# Patient Record
Sex: Female | Born: 1960
Health system: Southern US, Community
[De-identification: ages and names within clinical notes are randomized; demographics above are authoritative.]

## PROBLEM LIST (undated history)

## (undated) DIAGNOSIS — R7303 Prediabetes: Secondary | ICD-10-CM

## (undated) DIAGNOSIS — I10 Essential (primary) hypertension: Secondary | ICD-10-CM

## (undated) HISTORY — DX: Essential (primary) hypertension: I10

## (undated) HISTORY — DX: Prediabetes: R73.03

---

## 2018-05-01 ENCOUNTER — Emergency Department (HOSPITAL_COMMUNITY)
Admission: EM | Admit: 2018-05-01 | Discharge: 2018-05-01 | Disposition: A | Discharge status: Home / Self Care | Payer: Self-pay | Attending: Emergency Medicine | Admitting: Emergency Medicine

## 2018-05-01 ENCOUNTER — Encounter: Payer: Self-pay | Admitting: Emergency Medicine

## 2018-05-01 DIAGNOSIS — L0291 Cutaneous abscess, unspecified: Secondary | ICD-10-CM

## 2018-05-01 DIAGNOSIS — L02212 Cutaneous abscess of back [any part, except buttock]: Secondary | ICD-10-CM | POA: Insufficient documentation

## 2018-05-01 MED ORDER — CEPHALEXIN 500 MG PO CAPS
500.0000 mg | ORAL_CAPSULE | Freq: Once | ORAL | Status: AC
  Administered 2018-05-01: 500 mg via ORAL
  Filled 2018-05-01: qty 1

## 2018-05-01 MED ORDER — CEPHALEXIN 500 MG PO CAPS: 500.0000 mg | ORAL_CAPSULE | Freq: Four times a day (QID) | ORAL | 0 refills | Status: AC

## 2018-05-01 MED ORDER — SULFAMETHOXAZOLE-TRIMETHOPRIM 800-160 MG PO TABS
1.0000 | ORAL_TABLET | Freq: Once | ORAL | Status: AC
  Administered 2018-05-01: 1 via ORAL
  Filled 2018-05-01: qty 1

## 2018-05-01 MED ORDER — SULFAMETHOXAZOLE-TRIMETHOPRIM 800-160 MG PO TABS: 1.0000 | ORAL_TABLET | Freq: Two times a day (BID) | ORAL | 0 refills | Status: AC

## 2018-05-01 MED ORDER — LIDOCAINE HCL (PF) 1 % IJ SOLN
5.0000 mL | Freq: Once | INTRAMUSCULAR | Status: AC
  Administered 2018-05-01: 5 mL
  Filled 2018-05-01: qty 30

## 2018-05-01 NOTE — Discharge Instructions (Addendum)
Follow up in 2 days for wound check and packing removal. Return sooner for any problems.

## 2018-05-01 NOTE — ED Provider Notes (Signed)
[facility]-EMERGENCY DEPT Provider Note   CSN: [id number] Arrival date & time: 05/01/18  1610     History   Chief Complaint Chief Complaint  Patient presents with  . Abscess    HPI Kathryne Hitchmani Karim-Taylor is a 57 y.o. female who presents to the ED with c/o abscess to her left side just under her bra strap. Patient reports that symptoms started a week ago and have gotten worse.   HPI  History reviewed. No pertinent past medical history.  There are no active problems to display for this patient.   History reviewed. No pertinent surgical history.   OB History   No obstetric history on file.      Home Medications    Prior to Admission medications   Medication Sig Start Date End Date Taking? Authorizing Provider  cephALEXin (KEFLEX) 500 MG capsule Take 1 capsule (500 mg total) by mouth 4 (four) times daily. 05/01/18   Janne NapoleonNeese, Clearnce Leja M, NP  sulfamethoxazole-trimethoprim (BACTRIM DS,SEPTRA DS) 800-160 MG tablet Take 1 tablet by mouth 2 (two) times daily for 7 days. 05/01/18 05/08/18  Janne NapoleonNeese, Daviyon Widmayer M, NP    Family History No family history on file.  Social History Social History   Tobacco Use  . Smoking status: Former Games developermoker  . Smokeless tobacco: Never Used  Substance Use Topics  . Alcohol use: Never    Frequency: Never  . Drug use: Never     Allergies   Patient has no allergy information on record.   Review of Systems Review of Systems   Physical Exam Updated Vital Signs BP (!) 184/110 (BP Location: Left Arm)   Pulse 82   Temp 98.1 F (36.7 C) (Oral)   Resp 18   Ht 5\' 4"  (1.626 m)   Wt 96.3 kg   SpO2 96%   BMI 36.42 kg/m   Physical Exam Vitals signs and nursing note reviewed.  Constitutional:      General: She is not in acute distress.    Appearance: She is well-developed. She is obese.  HENT:     Head: Normocephalic.     Mouth/Throat:     Mouth: Mucous membranes are moist.  Eyes:     Extraocular Movements: Extraocular  movements intact.  Neck:     Musculoskeletal: Neck supple.  Cardiovascular:     Rate and Rhythm: Normal rate.  Pulmonary:     Effort: Pulmonary effort is normal.  Musculoskeletal: Normal range of motion.  Skin:    General: Skin is warm and dry.          Comments: Approximately 3 cm raised, tender area with pustular center to the left side with surrounding erythema.   Neurological:     Mental Status: She is alert and oriented to person, place, and time.  Psychiatric:        Mood and Affect: Mood normal.      ED Treatments / Results  Labs (all labs ordered are listed, but only abnormal results are displayed) Labs Reviewed - No data to display  EKG None  Radiology No results found.  Procedures .Marland Kitchen.Incision and Drainage Date/Time: 05/01/2018 7:16 PM Performed by: Janne NapoleonNeese, Miarose Lippert M, NP Authorized by: Janne NapoleonNeese, Shermaine Brigham M, NP   Consent:    Consent obtained:  Verbal   Consent given by:  Patient   Risks discussed:  Bleeding and incomplete drainage   Alternatives discussed:  Alternative treatment Location:    Type:  Abscess   Location:  Trunk   Trunk location: left  side. Pre-procedure details:    Skin preparation:  Betadine Anesthesia (see MAR for exact dosages):    Anesthesia method:  Local infiltration   Local anesthetic:  Lidocaine 1% w/o epi Procedure type:    Complexity:  Complex Procedure details:    Needle aspiration: no     Incision types:  Single straight   Incision depth:  Dermal   Scalpel blade:  11   Wound management:  Probed and deloculated and irrigated with saline   Drainage:  Purulent   Drainage amount:  Copious   Packing materials:  1/4 in iodoform gauze Post-procedure details:    Patient tolerance of procedure:  Tolerated well, no immediate complications   (including critical care time)  Medications Ordered in ED Medications  lidocaine (PF) (XYLOCAINE) 1 % injection 5 mL (has no administration in time range)  cephALEXin (KEFLEX) capsule 500 mg (has  no administration in time range)  sulfamethoxazole-trimethoprim (BACTRIM DS,SEPTRA DS) 800-160 MG per tablet 1 tablet (has no administration in time range)     Initial Impression / Assessment and Plan / ED Course  I have reviewed the triage vital signs and the nursing notes. 57 y.o. female here with abscess to the left side stable for d/c after I&D without fever or red streaking. Will have patient f/u in 2 days for wound check and packing removal. She will return sooner for any problems. Will start antibiotics.   Final Clinical Impressions(s) / ED Diagnoses   Final diagnoses:  Abscess    ED Discharge Orders         Ordered    cephALEXin (KEFLEX) 500 MG capsule  4 times daily     05/01/18 1913    sulfamethoxazole-trimethoprim (BACTRIM DS,SEPTRA DS) 800-160 MG tablet  2 times daily     05/01/18 1913           Kerrie Buffaloeese, Calla Wedekind Plum[place]M, [place]NP 05/01/18 1918    Jacalyn LefevreHaviland, Julie, MD 05/01/18 2238

## 2018-05-01 NOTE — ED Triage Notes (Signed)
Patient here from home with complaints of abscess to left side under bra strap x1 week.

## 2018-05-03 ENCOUNTER — Encounter (HOSPITAL_COMMUNITY): Payer: Self-pay | Admitting: Emergency Medicine

## 2018-05-03 ENCOUNTER — Other Ambulatory Visit: Payer: Self-pay

## 2018-05-03 ENCOUNTER — Emergency Department (HOSPITAL_COMMUNITY)
Admission: EM | Admit: 2018-05-03 | Discharge: 2018-05-03 | Disposition: A | Discharge status: Home / Self Care | Payer: Self-pay | Attending: Emergency Medicine | Admitting: Emergency Medicine

## 2018-05-03 DIAGNOSIS — Z5189 Encounter for other specified aftercare: Secondary | ICD-10-CM | POA: Insufficient documentation

## 2018-05-03 DIAGNOSIS — Z87891 Personal history of nicotine dependence: Secondary | ICD-10-CM | POA: Insufficient documentation

## 2018-05-03 DIAGNOSIS — L0291 Cutaneous abscess, unspecified: Secondary | ICD-10-CM

## 2018-05-03 DIAGNOSIS — N611 Abscess of the breast and nipple: Secondary | ICD-10-CM | POA: Insufficient documentation

## 2018-05-03 NOTE — ED Provider Notes (Signed)
[facility]-EMERGENCY DEPT Provider Note   CSN: [id number] Arrival date & time: 05/03/18  1045     History   Chief Complaint Chief Complaint  Patient presents with  . Wound Check    HPI Hailey Barry is a 57 y.o. female who presents today for evaluation of wound recheck.  Patient was seen on 05/01/18 for abscess noted to left lateral side just underneath the breast.  At that time, she had the abscess drained and backing inserted.  Additionally, patient was discharged on antibiotics which she states she has been compliant with.  Patient states that she remove the drain in the shower this morning and did not notice any purulent drainage from the area.  She states that the surrounding redness and size has improved but she still feels a little bit of pain and soreness around the area.  She states she has not had any fevers.  The history is provided by the patient.    History reviewed. No pertinent past medical history.  There are no active problems to display for this patient.   History reviewed. No pertinent surgical history.   OB History   No obstetric history on file.      Home Medications    Prior to Admission medications   Medication Sig Start Date End Date Taking? Authorizing Provider  cephALEXin (KEFLEX) 500 MG capsule Take 1 capsule (500 mg total) by mouth 4 (four) times daily. 05/01/18   Janne NapoleonNeese, Hope M, NP  sulfamethoxazole-trimethoprim (BACTRIM DS,SEPTRA DS) 800-160 MG tablet Take 1 tablet by mouth 2 (two) times daily for 7 days. 05/01/18 05/08/18  Janne NapoleonNeese, Hope M, NP    Family History No family history on file.  Social History Social History   Tobacco Use  . Smoking status: Former Games developermoker  . Smokeless tobacco: Never Used  Substance Use Topics  . Alcohol use: Never    Frequency: Never  . Drug use: Never     Allergies   Patient has no known allergies.   Review of Systems Review of Systems  Constitutional: Negative for fever.    Skin: Positive for wound. Negative for color change.  All other systems reviewed and are negative.    Physical Exam Updated Vital Signs BP (!) 140/105 (BP Location: Left Arm)   Temp 98.1 F (36.7 C) (Oral)   Resp 16   SpO2 97%   Physical Exam Vitals signs and nursing note reviewed.  Constitutional:      Appearance: She is well-developed.  HENT:     Head: Normocephalic and atraumatic.  Eyes:     General: No scleral icterus.       Right eye: No discharge.        Left eye: No discharge.     Conjunctiva/sclera: Conjunctivae normal.  Pulmonary:     Effort: Pulmonary effort is normal.  Chest:    Skin:    General: Skin is warm and dry.  Neurological:     Mental Status: She is alert.  Psychiatric:        Speech: Speech normal.        Behavior: Behavior normal.      ED Treatments / Results  Labs (all labs ordered are listed, but only abnormal results are displayed) Labs Reviewed - No data to display  EKG None  Radiology No results found.  Procedures Wound packing Date/Time: 05/03/2018 12:58 PM Performed by: Maxwell CaulLayden, Lindsey A, PA-C Authorized by: Maxwell CaulLayden, Lindsey A, PA-C  Unsuccessful attempt Consent: Verbal consent obtained. Consent  given by: patient Patient understanding: patient states understanding of the procedure being performed Patient consent: the patient's understanding of the procedure matches consent given Procedure consent: procedure consent matches procedure scheduled Relevant documents: relevant documents present and verified Test results: test results available and properly labeled Site marked: the operative site was marked Imaging studies: imaging studies not available Patient identity confirmed: verbally with patient Comments: Once the area was cleaned, it was thoroughly and extensively irrigated with sterile saline.  There was some serosanguineous drainage with some purulent material expressed from the wound.  There appeared to be some  loculations of fluid.  Attempted to break up loculations but patient was unable to tolerate procedure.  Additionally, attempted to place a small amount of packing to keep the incision open but patient would not proceed and asked that the procedure be terminated.  Procedure was terminated at patient's request.    (including critical care time)  Medications Ordered in ED Medications - No data to display   Initial Impression / Assessment and Plan / ED Course  I have reviewed the triage vital signs and the nursing notes.  Pertinent labs & imaging results that were available during my care of the patient were reviewed by me and considered in my medical decision making (see chart for details).     57 year old female who presents for evaluation of wound check.  Was seen on 05/01/17 for abscess to left lateral side.  At that time, she had packing placed and was discharged home on antibiotics.  Comes back today for wound recheck.  She removed packing herself at home.  No fevers. Patient is afebrile, non-toxic appearing, sitting comfortably on examination table. Vital signs reviewed and stable.  On exam, she has a 0.5 cm linear laceration on left lateral side with some surrounding induration.  No overlying warmth, erythema.  Additionally, she has some palpable areas of loculation noted just lateral to the incision site.  When dressing was removed, there was serosanguineous drainage with some purulent material mixed in.  I discussed with patient that I would like to do further debridement of the wound and reinsert packing to continue to keep the incision open.  Offered local anesthesia but patient declined.  Wound debridement as documented above.  After irrigation, was able to get some amount of discharge from the area.  Attempted to insert some replace packing but patient asked that the procedure be terminated.  I discussed with patient the risk versus benefits of stopping the procedure at this time,  including but not limited to worsening of abscess, return of abscess.  Patient expresses understanding and wishes to proceed with termination of procedure.  Patient exhibits full medical decision-making capacity.  Start the patient on at home care measures.  Instructed patient to continue taking her antibiotics. Patient had ample opportunity for questions and discussion. All patient's questions were answered with full understanding. Strict return precautions discussed. Patient expresses understanding and agreement to plan.    Portions of this note were generated with Scientist, clinical (histocompatibility and immunogenetics)Dragon dictation software. Dictation errors may occur despite best attempts at proofreading.   Final Clinical Impressions(s) / ED Diagnoses   Final diagnoses:  Visit for wound check  Abscess    ED Discharge Orders    None       Maxwell CaulLayden, Lindsey A, PA-C 05/03/18 1344    Virgina NorfolkCuratolo, Adam, DO 05/03/18 1900

## 2018-05-03 NOTE — Discharge Instructions (Signed)
Apply warm compresses to the area to help continue express drainage.   Keep the wound clean and dry. Gently wash the wound with soap and water and make sure to pat it dry.   Take antibiotic and complete the entire course.   You can take Tylenol or Ibuprofen as directed for pain.  [facility] to establish a primary care doctor if you do not have one.   Return to the Emergency Department if you experienced any worsening/spreading redness or swelling, fever, worsening pain, or any other worsening or concerning symptoms.

## 2018-05-03 NOTE — ED Notes (Signed)
Bed: WTR5 Expected date:  Expected time:  Means of arrival:  Comments: 

## 2018-05-03 NOTE — ED Triage Notes (Signed)
Pt here for wound check per Hope's instructions, pt had abscess drained 12/26. Pt removed packing when showering today, no drainage noted.

## 2018-09-08 ENCOUNTER — Emergency Department (HOSPITAL_COMMUNITY): Payer: Self-pay

## 2018-09-08 ENCOUNTER — Emergency Department (HOSPITAL_COMMUNITY)
Admission: EM | Admit: 2018-09-08 | Discharge: 2018-09-08 | Disposition: A | Discharge status: Home / Self Care | Payer: Self-pay | Attending: Emergency Medicine | Admitting: Emergency Medicine

## 2018-09-08 ENCOUNTER — Encounter (HOSPITAL_COMMUNITY): Payer: Self-pay | Admitting: Emergency Medicine

## 2018-09-08 ENCOUNTER — Other Ambulatory Visit: Payer: Self-pay

## 2018-09-08 DIAGNOSIS — Z79899 Other long term (current) drug therapy: Secondary | ICD-10-CM | POA: Insufficient documentation

## 2018-09-08 DIAGNOSIS — R1011 Right upper quadrant pain: Secondary | ICD-10-CM

## 2018-09-08 DIAGNOSIS — F17210 Nicotine dependence, cigarettes, uncomplicated: Secondary | ICD-10-CM | POA: Insufficient documentation

## 2018-09-08 DIAGNOSIS — R197 Diarrhea, unspecified: Secondary | ICD-10-CM

## 2018-09-08 DIAGNOSIS — E860 Dehydration: Secondary | ICD-10-CM | POA: Insufficient documentation

## 2018-09-08 DIAGNOSIS — R112 Nausea with vomiting, unspecified: Secondary | ICD-10-CM | POA: Insufficient documentation

## 2018-09-08 LAB — LIPASE, BLOOD: Lipase: 31 U/L (ref 11–51)

## 2018-09-08 LAB — URINALYSIS, ROUTINE W REFLEX MICROSCOPIC
Bilirubin Urine: NEGATIVE
Glucose, UA: NEGATIVE mg/dL
Ketones, ur: 20 mg/dL — AB
Leukocytes,Ua: NEGATIVE
Nitrite: NEGATIVE
Protein, ur: 30 mg/dL — AB
Specific Gravity, Urine: 1.021 (ref 1.005–1.030)
pH: 5 (ref 5.0–8.0)

## 2018-09-08 LAB — COMPREHENSIVE METABOLIC PANEL
ALT: 41 U/L (ref 0–44)
AST: 41 U/L (ref 15–41)
Albumin: 3.8 g/dL (ref 3.5–5.0)
Alkaline Phosphatase: 70 U/L (ref 38–126)
Anion gap: 10 (ref 5–15)
BUN: 11 mg/dL (ref 6–20)
CO2: 23 mmol/L (ref 22–32)
Calcium: 8.8 mg/dL — ABNORMAL LOW (ref 8.9–10.3)
Chloride: 104 mmol/L (ref 98–111)
Creatinine, Ser: 0.79 mg/dL (ref 0.44–1.00)
GFR calc Af Amer: >60 mL/min (ref >60)
GFR calc non Af Amer: >60 mL/min (ref >60)
Glucose, Bld: 131 mg/dL — ABNORMAL HIGH (ref 70–99)
Potassium: 3.5 mmol/L (ref 3.5–5.1)
Sodium: 137 mmol/L (ref 135–145)
Total Bilirubin: 0.6 mg/dL (ref 0.3–1.2)
Total Protein: 7.6 g/dL (ref 6.5–8.1)

## 2018-09-08 LAB — CBC WITH DIFFERENTIAL/PLATELET
Abs Immature Granulocytes: 0.06 10*3/uL (ref 0.00–0.07)
Basophils Absolute: 0 10*3/uL (ref 0.0–0.1)
Basophils Relative: 1 %
Eosinophils Absolute: 0 10*3/uL (ref 0.0–0.5)
Eosinophils Relative: 0 %
HCT: 43.8 % (ref 36.0–46.0)
Hemoglobin: 14.9 g/dL (ref 12.0–15.0)
Immature Granulocytes: 2 %
Lymphocytes Relative: 39 %
Lymphs Abs: 1.3 10*3/uL (ref 0.7–4.0)
MCH: 31.4 pg (ref 26.0–34.0)
MCHC: 34 g/dL (ref 30.0–36.0)
MCV: 92.2 fL (ref 80.0–100.0)
Monocytes Absolute: 0.4 10*3/uL (ref 0.1–1.0)
Monocytes Relative: 12 %
Neutro Abs: 1.6 10*3/uL — ABNORMAL LOW (ref 1.7–7.7)
Neutrophils Relative %: 46 %
Platelets: 269 10*3/uL (ref 150–400)
RBC: 4.75 MIL/uL (ref 3.87–5.11)
RDW: 13.3 % (ref 11.5–15.5)
WBC: 3.4 10*3/uL — ABNORMAL LOW (ref 4.0–10.5)
nRBC: 0 % (ref 0.0–0.2)

## 2018-09-08 LAB — I-STAT BETA HCG BLOOD, ED (MC, WL, AP ONLY): I-stat hCG, quantitative: <5 m[IU]/mL (ref <5)

## 2018-09-08 LAB — LACTIC ACID, PLASMA: Lactic Acid, Venous: 0.8 mmol/L (ref 0.5–1.9)

## 2018-09-08 MED ORDER — IOHEXOL 300 MG/ML  SOLN
100.0000 mL | Freq: Once | INTRAMUSCULAR | Status: AC | PRN
  Administered 2018-09-08: 100 mL via INTRAVENOUS

## 2018-09-08 MED ORDER — DICYCLOMINE HCL 10 MG/5ML PO SOLN
10.0000 mg | Freq: Once | ORAL | Status: AC
  Administered 2018-09-08: 10 mg via ORAL
  Filled 2018-09-08: qty 5

## 2018-09-08 MED ORDER — ONDANSETRON 4 MG PO TBDP: 4.0000 mg | ORAL_TABLET | Freq: Three times a day (TID) | ORAL | 0 refills | Status: AC | PRN

## 2018-09-08 MED ORDER — HYDROMORPHONE HCL 1 MG/ML IJ SOLN
1.0000 mg | Freq: Once | INTRAMUSCULAR | Status: AC
  Administered 2018-09-08: 1 mg via INTRAVENOUS
  Filled 2018-09-08: qty 1

## 2018-09-08 MED ORDER — KETOROLAC TROMETHAMINE 15 MG/ML IJ SOLN
15.0000 mg | Freq: Once | INTRAMUSCULAR | Status: AC
  Administered 2018-09-08: 15 mg via INTRAVENOUS
  Filled 2018-09-08: qty 1

## 2018-09-08 MED ORDER — SODIUM CHLORIDE 0.9 % IV BOLUS
1000.0000 mL | Freq: Once | INTRAVENOUS | Status: AC
  Administered 2018-09-08: 1000 mL via INTRAVENOUS

## 2018-09-08 MED ORDER — SODIUM CHLORIDE (PF) 0.9 % IJ SOLN
INTRAMUSCULAR | Status: AC
  Filled 2018-09-08: qty 50

## 2018-09-08 MED ORDER — DICYCLOMINE HCL 20 MG PO TABS: 20.0000 mg | ORAL_TABLET | Freq: Three times a day (TID) | ORAL | 0 refills | Status: AC

## 2018-09-08 MED ORDER — ONDANSETRON HCL 4 MG/2ML IJ SOLN
4.0000 mg | Freq: Once | INTRAMUSCULAR | Status: AC
  Administered 2018-09-08: 4 mg via INTRAVENOUS
  Filled 2018-09-08: qty 2

## 2018-09-08 MED ORDER — LOPERAMIDE HCL 2 MG PO CAPS: 2.0000 mg | ORAL_CAPSULE | Freq: Four times a day (QID) | ORAL | 0 refills | Status: AC | PRN

## 2018-09-08 NOTE — ED Notes (Signed)
Ultrasound at bedside

## 2018-09-08 NOTE — ED Notes (Signed)
Pt has ice water

## 2018-09-08 NOTE — ED Triage Notes (Signed)
Pt reports since Thursday she been feeling bad and not able to keep anything down and having diarrhea. Pt having abd pains esp with drinking on Saturday .  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

## 2018-09-08 NOTE — ED Provider Notes (Signed)
[facility]-EMERGENCY DEPT Provider Note   CSN: [id number] Arrival date & time: 09/08/18  0945    History   Chief Complaint Chief Complaint  Patient presents with   Fatigue   Emesis    HPI Hailey Barry is a 58 y.o. female.     HPI   58 year old female here with nausea, vomiting, diarrhea.  The patient states her symptoms started gradually on Thursday, approximately 4 days ago.  She states that she began to develop nausea, epigastric and right upper quadrant pain, that was worse with eating.  She then began vomiting which she describes as orange, nonbloody emesis.  She also noticed that her stools are significantly darker than usual.  The next day, her symptoms had slightly improved but then returned and have since progressively worsened.  She describes a severe, aching, throbbing, epigastric and right upper quadrant pain with any kind of eating.  She has had some associated diarrhea that has been dark brown and occasionally which she thinks is black.  No bright red blood.  Denies any heavy NSAID use.  No history of ulcers.  No history of previous pancreatitis or heavy alcohol intake.  No other complaints.  Symptoms are worse with eating and drinking.  No alleviating factors.  History reviewed. No pertinent past medical history.  There are no active problems to display for this patient.   History reviewed. No pertinent surgical history.   OB History   No obstetric history on file.      Home Medications    Prior to Admission medications   Medication Sig Start Date End Date Taking? Authorizing Provider  ibuprofen (ADVIL) 200 MG tablet Take 400 mg by mouth every 6 (six) hours as needed for headache or moderate pain.   Yes [provider]  Multiple Vitamin (MULTIVITAMIN WITH MINERALS) TABS tablet Take 1 tablet by mouth daily.   Yes [provider]  naproxen sodium (ALEVE) 220 MG tablet Take 220 mg by mouth 2 (two) times daily as  needed (pain/headache).   Yes [provider]  cephALEXin (KEFLEX) 500 MG capsule Take 1 capsule (500 mg total) by mouth 4 (four) times daily. Patient not taking: Reported on 09/08/2018 05/01/18   Janne Napoleon, NP  dicyclomine (BENTYL) 20 MG tablet Take 1 tablet (20 mg total) by mouth 4 (four) times daily -  before meals and at bedtime for 5 days. 09/08/18 09/13/18  Shaune Pollack, MD  loperamide (IMODIUM) 2 MG capsule Take 1 capsule (2 mg total) by mouth 4 (four) times daily as needed for diarrhea or loose stools. 09/08/18   Shaune Pollack, MD  ondansetron (ZOFRAN ODT) 4 MG disintegrating tablet Take 1 tablet (4 mg total) by mouth every 8 (eight) hours as needed for nausea or vomiting. 09/08/18   Shaune Pollack, MD    Family History No family history on file.  Social History Social History   Tobacco Use   Smoking status: Current Every Day Smoker    Types: Cigarettes   Smokeless tobacco: Never Used  Substance Use Topics   Alcohol use: Never    Frequency: Never   Drug use: Never     Allergies   Patient has no known allergies.   Review of Systems Review of Systems  Constitutional: Positive for fatigue and fever. Negative for chills.  HENT: Negative for congestion, rhinorrhea and sore throat.   Eyes: Negative for visual disturbance.  Respiratory: Negative for cough, shortness of breath and wheezing.   Cardiovascular: Negative for  chest pain and leg swelling.  Gastrointestinal: Positive for abdominal pain, diarrhea, nausea and vomiting.  Genitourinary: Negative for dysuria, flank pain, vaginal bleeding and vaginal discharge.  Musculoskeletal: Negative for neck pain.  Skin: Negative for rash.  Allergic/Immunologic: Negative for immunocompromised state.  Neurological: Positive for weakness. Negative for syncope and headaches.  Hematological: Does not bruise/bleed easily.  All other systems reviewed and are negative.    Physical Exam Updated Vital Signs BP (!) 143/89     Pulse 82    Temp 100.3 F (37.9 C) (Oral)    Resp 18    SpO2 100%   Physical Exam Vitals signs and nursing note reviewed.  Constitutional:      General: She is not in acute distress.    Appearance: She is well-developed.  HENT:     Head: Normocephalic and atraumatic.     Mouth/Throat:     Mouth: Mucous membranes are dry.  Eyes:     Conjunctiva/sclera: Conjunctivae normal.  Neck:     Musculoskeletal: Neck supple.  Cardiovascular:     Rate and Rhythm: Normal rate and regular rhythm.     Heart sounds: Normal heart sounds. No murmur. No friction rub.  Pulmonary:     Effort: Pulmonary effort is normal. No respiratory distress.     Breath sounds: Normal breath sounds. No wheezing or rales.  Abdominal:     General: There is no distension.     Palpations: Abdomen is soft.     Tenderness: There is no abdominal tenderness (mild, diffuse but worse in epigastric and RUQ areas).  Skin:    General: Skin is warm.     Capillary Refill: Capillary refill takes less than 2 seconds.  Neurological:     Mental Status: She is alert and oriented to person, place, and time.     Motor: No abnormal muscle tone.      ED Treatments / Results  Labs (all labs ordered are listed, but only abnormal results are displayed) Labs Reviewed  CBC WITH DIFFERENTIAL/PLATELET - Abnormal; Notable for the following components:      Result Value   WBC 3.4 (*)    Neutro Abs 1.6 (*)    All other components within normal limits  COMPREHENSIVE METABOLIC PANEL - Abnormal; Notable for the following components:   Glucose, Bld 131 (*)    Calcium 8.8 (*)    All other components within normal limits  URINALYSIS, ROUTINE W REFLEX MICROSCOPIC - Abnormal; Notable for the following components:   Color, Urine YELLOW (*)    APPearance CLOUDY (*)    Hgb urine dipstick SMALL (*)    Ketones, ur 20 (*)    Protein, ur 30 (*)    Bacteria, UA RARE (*)    Crystals PRESENT (*)    All other components within normal limits    CULTURE, BLOOD (ROUTINE X 2)  CULTURE, BLOOD (ROUTINE X 2)  LIPASE, BLOOD  LACTIC ACID, PLASMA  I-STAT BETA HCG BLOOD, ED ([facility], [facility], [facility] ONLY)    EKG None  Radiology Ct Abdomen Pelvis W Contrast  Result Date: 09/08/2018 CLINICAL DATA:  Generalized acute abdominal pain, nausea and vomiting. EXAM: CT ABDOMEN AND PELVIS WITH CONTRAST TECHNIQUE: Multidetector CT imaging of the abdomen and pelvis was performed using the standard protocol following bolus administration of intravenous contrast. CONTRAST:  100mL OMNIPAQUE IOHEXOL 300 MG/ML  SOLN COMPARISON:  Right upper quadrant abdominal ultrasound-09/08/2018 FINDINGS: Lower chest: Limited visualization of lower thorax demonstrates minimal dependent subpleural ground-glass atelectasis. No discrete  focal airspace opacities. No pleural effusion. Normal heart size.  No pericardial effusion. Hepatobiliary: Normal hepatic contour. No discrete hepatic lesions. Normal appearance of the gallbladder given degree distention. No radiopaque gallstones. No intra or extrahepatic biliary ductal dilatation. No ascites. Pancreas: Normal appearance of the pancreas. Spleen: Appearance of the spleen. Adrenals/Urinary Tract: There is symmetric enhancement and excretion of the bilateral kidneys. No definite renal stones on this postcontrast examination. Punctate (approximately 0.8 cm) hypoattenuating lesion within the inferior pole the left kidney (image 68, series 4), is too small to adequately characterize of favored to represent a renal cyst. No definite right-sided renal lesions. No urine obstruction or perinephric stranding. Normal appearance of the bilateral adrenal glands. Normal appearance of the urinary bladder given underdistention. Stomach/Bowel: Small hiatal hernia. The bowel is otherwise normal in course and caliber without wall thickening or evidence of enteric obstruction. Normal appearance of the terminal ileum and the retrocecal appendix. No pneumoperitoneum,  pneumatosis or portal venous gas. Vascular/Lymphatic: Normal caliber the abdominal aorta. The major branch vessels of the abdominal aorta appear patent on this non CTA examination. No bulky retroperitoneal, mesenteric, pelvic or inguinal lymphadenopathy. Reproductive: Enlarged Myomatous uterus with dominant fibroid within the right-sided the dome of the uterine fundus measuring approximately 7.6 x 7.5 cm (axial image 65, series 2) and dominant fibroid within the lower uterine segment measuring approximately 6.7 x 6.3 cm (coronal image 83, series 4). Several of the uterine fibroids have undergone partial calcific degeneration. No discrete adnexal lesion. No free fluid in the pelvic cul-de-sac. Other: Tiny mesenteric fat containing periumbilical hernia. Musculoskeletal: No acute or aggressive osseous abnormalities. IMPRESSION: 1. No explanation for patient's abdominal pain and diarrhea. Specifically, no evidence of enteric or urinary obstruction. Normal appearance of the retrocecal appendix. 2. Small hiatal hernia. 3. Enlarged myomatous uterus. Electronically Signed   By: Simonne Come M.D.   On: 09/08/2018 12:16   US Abdomen Limited Ruq  Result Date: 09/08/2018 CLINICAL DATA:  Right upper quadrant pain for the past 4 days. EXAM: ULTRASOUND ABDOMEN LIMITED RIGHT UPPER QUADRANT COMPARISON:  None. FINDINGS: Gallbladder: No gallstones or wall thickening visualized. No sonographic Murphy sign noted by sonographer. Common bile duct: Diameter: 2 mm, normal. Liver: No focal lesion identified. Mildly increased, heterogeneous parenchymal echogenicity. Portal vein is patent on color Doppler imaging with normal direction of blood flow towards the liver. IMPRESSION: 1. No acute abnormality. 2. Mildly increased hepatic echogenicity, suggestive of steatosis. Electronically Signed   By: Obie Dredge M.D.   On: 09/08/2018 11:02    Procedures Procedures (including critical care time)  Medications Ordered in ED Medications    sodium chloride (PF) 0.9 % injection (has no administration in time range)  ketorolac (TORADOL) 15 MG/ML injection 15 mg (has no administration in time range)  sodium chloride 0.9 % bolus 1,000 mL (1,000 mLs Intravenous Bolus 09/08/18 1039)  sodium chloride 0.9 % bolus 1,000 mL (1,000 mLs Intravenous Bolus 09/08/18 1040)  HYDROmorphone (DILAUDID) injection 1 mg (1 mg Intravenous Given 09/08/18 1039)  ondansetron (ZOFRAN) injection 4 mg (4 mg Intravenous Given 09/08/18 1039)  iohexol (OMNIPAQUE) 300 MG/ML solution 100 mL (100 mLs Intravenous Contrast Given 09/08/18 1150)  dicyclomine (BENTYL) 10 MG/5ML syrup 10 mg (10 mg Oral Given 09/08/18 1254)     Initial Impression / Assessment and Plan / ED Course  I have reviewed the triage vital signs and the nursing notes.  Pertinent labs & imaging results that were available during my care of the patient were reviewed by me  and considered in my medical decision making (see chart for details).  Clinical Course as of Sep 07 1253  Mon Sep 08, 2018  1120 US Abdomen Limited RUQ [CI]  1121 58 yo F here with nausea, vomiting, diarrhea, fever. Suspect viral GI illness. U/S neg for gallstones. Labs overall reassuring - CBC with mild likely viral leukopenia, CMP w/ normal LFTs and Bili. UA with ketonuria c/w dehydration but no pyuria, hematuria or signs of UTI or stone. WIll check CT given her diffuse TTP to eval perf ulcer, appendicitis, and re-assess. IVF given for dehydration.   [CI]  1223 CT scan is reviewed and negative.  She feels markedly improved.  She been given 2 L of fluid and is now tolerating p.o.  Suspect this is likely viral GI syndrome.  No evidence to suggest current infection she has no cough or respiratory symptoms.  Will treat symptomatically, encourage fluids, and discharged home.   [CI]    Clinical Course User Index [CI] Shaune Pollack, MD        Final Clinical Impressions(s) / ED Diagnoses   Final diagnoses:  RUQ pain  Nausea vomiting  and diarrhea  Dehydration    ED Discharge Orders         Ordered    dicyclomine (BENTYL) 20 MG tablet  3 times daily before meals & bedtime     09/08/18 1248    ondansetron (ZOFRAN ODT) 4 MG disintegrating tablet  Every 8 hours PRN     09/08/18 1248    loperamide (IMODIUM) 2 MG capsule  4 times daily PRN     09/08/18 1248           Shaune Pollack, MD 09/08/18 1255

## 2018-09-08 NOTE — TOC Initial Note (Signed)
Transition of Care [facility]) - Initial/Assessment Note    Patient Details  Name: Hailey Barry MRN: [id number] Date of Birth: 1960/07/15  Transition [facility]) CM/SW Contact:    Elliot Cousin, RN Phone Number: 09/08/2018, 3:39 PM  Clinical Narrative:                  3 ED visits in past six months, pt reports not having a PCP. She currently works. Able to afford meds with goodrx. Called in goodrx coupons to [employer]. Zofran $ 13.21 and Bentyl 3.98. Will arrange follow up appt with [facility].     Expected Discharge Plan: Home/Self Care Barriers to Discharge: No Barriers Identified   Patient Goals and CMS Choice        Expected Discharge Plan and Services Expected Discharge Plan: Home/Self Care   Discharge Planning Services: CM Consult, Medication Assistance, Indigent Health Clinic   Living arrangements for the past 2 months: Apartment                 DME Arranged: N/A DME Agency: NA                  Prior Living Arrangements/Services Living arrangements for the past 2 months: Apartment Lives with:: Self Patient language and need for interpreter reviewed:: Yes        Need for Family Participation in Patient Care: No (Comment) Care giver support system in place?: No (comment)   Criminal Activity/Legal Involvement Pertinent to Current Situation/Hospitalization: No - Comment as needed  Activities of Daily Living      Permission Sought/Granted Permission sought to share information with : Case Manager, PCP, Other (comment) Permission granted to share information with : Yes, Verbal Permission Granted     Permission granted to share info w AGENCY: [employer]        Emotional Assessment Appearance:: Appears stated age Attitude/Demeanor/Rapport: Engaged   Orientation: : Oriented to Self, Oriented to  Time, Oriented to Place, Oriented to Situation   Psych Involvement: No (comment)  Admission diagnosis:  N/V/D  There are no active problems to display for  this patient.  PCP:  Patient, No Pcp Per Pharmacy:   [facility] [address] ([address]), [place] - [address] [place] ([place]) [place] [address] Phone: [phone] Fax: [phone]     Social Determinants of Health (SDOH) Interventions    Readmission Risk Interventions No flowsheet data found.

## 2018-09-08 NOTE — Discharge Instructions (Signed)
For fever, I recommend taking Tylenol 1000 mg every 6 hours, or ibuprofen 600 mg every 6-8 hours as needed  I have prescribed medications to help with stomach cramps, nausea, and diarrhea.  Take these as prescribed.  Try to avoid any spicy foods or foods high in fat.  Try to stick to bland foods such as rice, white bread.

## 2018-09-14 LAB — CULTURE, BLOOD (ROUTINE X 2)
Culture: NO GROWTH
Culture: NO GROWTH
Special Requests: ADEQUATE
Special Requests: ADEQUATE

## 2018-09-17 ENCOUNTER — Ambulatory Visit: Payer: Self-pay | Attending: Family Medicine | Admitting: Family Medicine

## 2018-09-17 ENCOUNTER — Other Ambulatory Visit: Payer: Self-pay

## 2018-09-17 ENCOUNTER — Encounter: Payer: Self-pay | Admitting: Family Medicine

## 2018-09-17 VITALS — BP 141/95 | HR 89 | Temp 98.1°F | Ht 64.0 in | Wt 214.4 lb

## 2018-09-17 DIAGNOSIS — I10 Essential (primary) hypertension: Secondary | ICD-10-CM

## 2018-09-17 DIAGNOSIS — R7303 Prediabetes: Secondary | ICD-10-CM

## 2018-09-17 DIAGNOSIS — R7309 Other abnormal glucose: Secondary | ICD-10-CM

## 2018-09-17 LAB — POCT GLYCOSYLATED HEMOGLOBIN (HGB A1C): Hemoglobin A1C: 6.1 % — AB (ref 4.0–5.6)

## 2018-09-17 MED ORDER — LOSARTAN POTASSIUM 50 MG PO TABS: 50.0000 mg | ORAL_TABLET | Freq: Every day | ORAL | 3 refills | Status: DC

## 2018-09-17 MED ORDER — METFORMIN HCL 500 MG PO TABS: ORAL_TABLET | ORAL | 1 refills | Status: DC

## 2018-09-17 MED FILL — metFORMIN HCL 500 MG TABS: 500 | 30 days supply | Qty: 30 | Fill #0

## 2018-09-17 MED FILL — LOSARTAN POTASSIUM 50 MG TA: 50 | 30 days supply | Qty: 30 | Fill #0

## 2018-09-17 NOTE — Progress Notes (Signed)
Hospital F/u  Per pt she is not having stomach pain per pt she needs a doctor.  Per pt she feels like she have HTN and just feels like sometimes she's not well  Talk about her Ondansetron

## 2018-09-17 NOTE — Progress Notes (Signed)
New Patient Office Visit  Subjective:  Patient ID: Hailey Barry, female    DOB: January 12, 1961  Age: 58 y.o. MRN: [id number]  CC: No chief complaint on file.   HPI Hailey Barry presents to establish care and she is status post ED visit on 09/08/2018 due to nausea, vomiting and diarrhea. She had right upper quadrant [place] which showed mild fatty liver, gallbladder normal. CT of the abdomen/pelvis showed presence of uterine fibroids and a small hiatal hernia but no cause for her GI symptoms. LFT's normal. Glucose of 131. Patient reports that her N/V/D have resolved. No abdominal pain. She did take zofran for nausea after her ED visit but this medication made her feel funny.  Both parents have DM and patient is concerned that she may have this as well. She reports increased thirst and frequent urination especially at night. (UA done in the ED did not have nitrites or leukocytes).  She has some issues with difficulty sleeping but is not sure if she snores. She has some day time fatigue. She previously was on medication for her blood pressure but had not established medical care since she moved here so not currently on medication. Occasional mild, dull generalized headaches. She would like to be back on medication to help lower her blood pressure.    Past Medical History:  Diagnosis Date  . Hypertension     Past Surgical History:  Procedure Laterality Date  . CESAREAN SECTION      Family History  Problem Relation Age of Onset  . Diabetes Mother   . Hypertension Mother   . Diabetes Father   . Hypertension Maternal Grandmother     Social History   Tobacco Use  . Smoking status: Former Smoker    Types: Cigarettes  . Smokeless tobacco: Never Used  Substance Use Topics  . Alcohol use: Never    Frequency: Never  . Drug use: Yes    Types: Marijuana   Current Outpatient Medications on File Prior to Visit  Medication Sig Dispense Refill  . Multiple Vitamin (MULTIVITAMIN WITH  MINERALS) TABS tablet Take 1 tablet by mouth daily.    . cephALEXin (KEFLEX) 500 MG capsule Take 1 capsule (500 mg total) by mouth 4 (four) times daily. (Patient not taking: Reported on 09/08/2018) 28 capsule 0  . dicyclomine (BENTYL) 20 MG tablet Take 1 tablet (20 mg total) by mouth 4 (four) times daily -  before meals and at bedtime for 5 days. (Patient not taking: Reported on 09/17/2018) 20 tablet 0  . ibuprofen (ADVIL) 200 MG tablet Take 400 mg by mouth every 6 (six) hours as needed for headache or moderate pain.    Marland Kitchen loperamide (IMODIUM) 2 MG capsule Take 1 capsule (2 mg total) by mouth 4 (four) times daily as needed for diarrhea or loose stools. (Patient not taking: Reported on 09/17/2018) 12 capsule 0  . naproxen sodium (ALEVE) 220 MG tablet Take 220 mg by mouth 2 (two) times daily as needed (pain/headache).    . ondansetron (ZOFRAN ODT) 4 MG disintegrating tablet Take 1 tablet (4 mg total) by mouth every 8 (eight) hours as needed for nausea or vomiting. (Patient not taking: Reported on 09/17/2018) 15 tablet 0   No current facility-administered medications on file prior to visit.     ROS Review of Systems  Constitutional: Positive for fatigue. Negative for chills and fever.  HENT: Negative for congestion.   Eyes: Negative for photophobia and visual disturbance.  Respiratory: Negative for cough and shortness  of breath.   Cardiovascular: Negative for chest pain and palpitations.  Gastrointestinal: Negative for abdominal pain, blood in stool, constipation, diarrhea, nausea and vomiting.  Endocrine: Positive for polydipsia and polyuria. Negative for polyphagia.  Genitourinary: Positive for frequency. Negative for dysuria.  Musculoskeletal: Negative for arthralgias, gait problem and joint swelling.  Neurological: Positive for headaches. Negative for dizziness.  Hematological: Negative for adenopathy. Does not bruise/bleed easily.    Objective:   Today's Vitals: BP (!) 141/95 (BP Location:  Right Arm, Patient Position: Sitting, Cuff Size: Large)   Pulse 89   Temp 98.1 F (36.7 C) (Oral)   Ht 5\' 4"  (1.626 m)   Wt 214 lb 6.4 oz (97.3 kg)   SpO2 97%   BMI 36.80 kg/m   Physical Exam Vitals signs and nursing note reviewed.  Constitutional:      Appearance: Normal appearance. She is obese.  Neck:     Musculoskeletal: Normal range of motion and neck supple. No muscular tenderness.  Cardiovascular:     Rate and Rhythm: Normal rate and regular rhythm.  Pulmonary:     Effort: Pulmonary effort is normal.     Breath sounds: Normal breath sounds.  Abdominal:     General: Bowel sounds are normal.     Palpations: Abdomen is soft. There is no mass.     Tenderness: There is no abdominal tenderness. There is no right CVA tenderness, left CVA tenderness, guarding or rebound.  Musculoskeletal:        General: No swelling or tenderness.     Right lower leg: No edema.     Left lower leg: No edema.  Lymphadenopathy:     Cervical: No cervical adenopathy.  Skin:    General: Skin is warm and dry.  Neurological:     General: No focal deficit present.     Mental Status: She is alert and oriented to person, place, and time.  Psychiatric:        Mood and Affect: Mood normal.        Behavior: Behavior normal.     Assessment & Plan:  1. Essential hypertension Patient with elevated blood pressure here in the office as well as during her ED visit on 09/08/2018 (BP 143/89). Discussed diagnosis of hypertension and patient states that she was diagnosed with hypertension in the past and has not been on medication therefore she would like to restart medication. Since she also had a family history of diabetes and is concerned about becoming diabetic, patient will be placed on Losartan 50 mg and information on DASH diet provided as part of her after visit summary. Creatinine on recent blood work in the ED was normal at 0.79.  - losartan (COZAAR) 50 MG tablet; Take 1 tablet (50 mg total) by mouth  daily. To lower blood pressure  Dispense: 30 tablet; Refill: 3  2. Elevated glucose level; 3. Prediabetes Glucose was 131 on recent labs from the ED and patient with family history of both parents with diabetes. Hgb A1c done at today's visit which was 6.1 consistent with pre-diabetes. Discussed that value was consistent with pre-diabetes. Discussed the need for a low carb diet and regular exercise as well as use of metformin to help with insulin resistance. Patient agrees to start the use of metformin 500 mg after her evening meal and discussed possible side effect of GI upset which generally resolves after 1-2 weeks of use-call if symptoms continue.  - HgB A1c - metFORMIN (GLUCOPHAGE) 500 MG tablet; One pill daily  after the evening meal  Dispense: 90 tablet; Refill: 1   Outpatient Encounter Medications as of 09/17/2018  Medication Sig  . Multiple Vitamin (MULTIVITAMIN WITH MINERALS) TABS tablet Take 1 tablet by mouth daily.  . cephALEXin (KEFLEX) 500 MG capsule Take 1 capsule (500 mg total) by mouth 4 (four) times daily. (Patient not taking: Reported on 09/08/2018)  . dicyclomine (BENTYL) 20 MG tablet Take 1 tablet (20 mg total) by mouth 4 (four) times daily -  before meals and at bedtime for 5 days. (Patient not taking: Reported on 09/17/2018)  . ibuprofen (ADVIL) 200 MG tablet Take 400 mg by mouth every 6 (six) hours as needed for headache or moderate pain.  Marland Kitchen. loperamide (IMODIUM) 2 MG capsule Take 1 capsule (2 mg total) by mouth 4 (four) times daily as needed for diarrhea or loose stools. (Patient not taking: Reported on 09/17/2018)  . losartan (COZAAR) 50 MG tablet Take 1 tablet (50 mg total) by mouth daily. To lower blood pressure  . metFORMIN (GLUCOPHAGE) 500 MG tablet One pill daily after the evening meal  . naproxen sodium (ALEVE) 220 MG tablet Take 220 mg by mouth 2 (two) times daily as needed (pain/headache).  . ondansetron (ZOFRAN ODT) 4 MG disintegrating tablet Take 1 tablet (4 mg total) by  mouth every 8 (eight) hours as needed for nausea or vomiting. (Patient not taking: Reported on 09/17/2018)   No facility-administered encounter medications on file as of 09/17/2018.     Follow-up: Return in about 4 weeks (around 10/15/2018) for BP.   Cain Saupeammie Parneet Glantz, MD

## 2018-09-17 NOTE — Patient Instructions (Addendum)
Your Hgb A1c was 6.1 consistent with pre-daibetes or an increased risk of developing diabetes DASH Eating Plan DASH stands for "Dietary Approaches to Stop Hypertension." The DASH eating plan is a healthy eating plan that has been shown to reduce high blood pressure (hypertension). It may also reduce your risk for type 2 diabetes, heart disease, and stroke. The DASH eating plan may also help with weight loss. What are tips for following this plan?  General guidelines  Avoid eating more than 2,300 mg (milligrams) of salt (sodium) a day. If you have hypertension, you may need to reduce your sodium intake to 1,500 mg a day.  Limit alcohol intake to no more than 1 drink a day for nonpregnant women and 2 drinks a day for men. One drink equals 12 oz of beer, 5 oz of wine, or 1 oz of hard liquor.  Work with your health care provider to maintain a healthy body weight or to lose weight. Ask what an ideal weight is for you.  Get at least 30 minutes of exercise that causes your heart to beat faster (aerobic exercise) most days of the week. Activities may include walking, swimming, or biking.  Work with your health care provider or diet and nutrition specialist (dietitian) to adjust your eating plan to your individual calorie needs. Reading food labels   Check food labels for the amount of sodium per serving. Choose foods with less than 5 percent of the Daily Value of sodium. Generally, foods with less than 300 mg of sodium per serving fit into this eating plan.  To find whole grains, look for the word "whole" as the first word in the ingredient list. Shopping  Buy products labeled as "low-sodium" or "no salt added."  Buy fresh foods. Avoid canned foods and premade or frozen meals. Cooking  Avoid adding salt when cooking. Use salt-free seasonings or herbs instead of table salt or sea salt. Check with your health care provider or pharmacist before using salt substitutes.  Do not fry foods. Cook  foods using healthy methods such as baking, boiling, grilling, and broiling instead.  Cook with heart-healthy oils, such as olive, canola, soybean, or sunflower oil. Meal planning  Eat a balanced diet that includes: ? 5 or more servings of fruits and vegetables each day. At each meal, try to fill half of your plate with fruits and vegetables. ? Up to 6-8 servings of whole grains each day. ? Less than 6 oz of lean meat, poultry, or fish each day. A 3-oz serving of meat is about the same size as a deck of cards. One egg equals 1 oz. ? 2 servings of low-fat dairy each day. ? A serving of nuts, seeds, or beans 5 times each week. ? Heart-healthy fats. Healthy fats called Omega-3 fatty acids are found in foods such as flaxseeds and coldwater fish, like sardines, salmon, and mackerel.  Limit how much you eat of the following: ? Canned or prepackaged foods. ? Food that is high in trans fat, such as fried foods. ? Food that is high in saturated fat, such as fatty meat. ? Sweets, desserts, sugary drinks, and other foods with added sugar. ? Full-fat dairy products.  Do not salt foods before eating.  Try to eat at least 2 vegetarian meals each week.  Eat more home-cooked food and less restaurant, buffet, and fast food.  When eating at a restaurant, ask that your food be prepared with less salt or no salt, if possible. What foods are  recommended? The items listed may not be a complete list. Talk with your dietitian about what dietary choices are best for you. Grains Whole-grain or whole-wheat bread. Whole-grain or whole-wheat pasta. Brown rice. [place]. Bulgur. Whole-grain and low-sodium cereals. Pita bread. Low-fat, low-sodium crackers. Whole-wheat flour tortillas. Vegetables Fresh or frozen vegetables (raw, steamed, roasted, or grilled). Low-sodium or reduced-sodium tomato and vegetable juice. Low-sodium or reduced-sodium tomato sauce and tomato paste. Low-sodium or reduced-sodium canned  vegetables. Fruits All fresh, dried, or frozen fruit. Canned fruit in natural juice (without added sugar). Meat and other protein foods Skinless chicken or [place]. Ground chicken or [place]. Pork with fat trimmed off. Fish and seafood. Egg whites. Dried beans, peas, or lentils. Unsalted nuts, nut butters, and seeds. Unsalted canned beans. Lean cuts of beef with fat trimmed off. Low-sodium, lean deli meat. Dairy Low-fat (1%) or fat-free (skim) milk. Fat-free, low-fat, or reduced-fat cheeses. Nonfat, low-sodium ricotta or cottage cheese. Low-fat or nonfat yogurt. Low-fat, low-sodium cheese. Fats and oils Soft margarine without trans fats. Vegetable oil. Low-fat, reduced-fat, or light mayonnaise and salad dressings (reduced-sodium). Canola, safflower, olive, soybean, and sunflower oils. Avocado. Seasoning and other foods Herbs. Spices. Seasoning mixes without salt. Unsalted popcorn and pretzels. Fat-free sweets. What foods are not recommended? The items listed may not be a complete list. Talk with your dietitian about what dietary choices are best for you. Grains Baked goods made with fat, such as croissants, muffins, or some breads. Dry pasta or rice meal packs. Vegetables Creamed or fried vegetables. Vegetables in a cheese sauce. Regular canned vegetables (not low-sodium or reduced-sodium). Regular canned tomato sauce and paste (not low-sodium or reduced-sodium). Regular tomato and vegetable juice (not low-sodium or reduced-sodium). Angie Fava. Olives. Fruits Canned fruit in a light or heavy syrup. Fried fruit. Fruit in cream or butter sauce. Meat and other protein foods Fatty cuts of meat. Ribs. Fried meat. Berniece Salines. Sausage. Bologna and other processed lunch meats. Salami. Fatback. Hotdogs. Bratwurst. Salted nuts and seeds. Canned beans with added salt. Canned or smoked fish. Whole eggs or egg yolks. Chicken or [place] with skin. Dairy Whole or 2% milk, cream, and half-and-half. Whole or full-fat  cream cheese. Whole-fat or sweetened yogurt. Full-fat cheese. Nondairy creamers. Whipped toppings. Processed cheese and cheese spreads. Fats and oils Butter. Stick margarine. Lard. Shortening. Ghee. Bacon fat. Tropical oils, such as coconut, palm kernel, or palm oil. Seasoning and other foods Salted popcorn and pretzels. Onion salt, garlic salt, seasoned salt, table salt, and sea salt. Worcestershire sauce. Tartar sauce. Barbecue sauce. Teriyaki sauce. Soy sauce, including reduced-sodium. Steak sauce. Canned and packaged gravies. Fish sauce. Oyster sauce. Cocktail sauce. Horseradish that you find on the shelf. Ketchup. Mustard. Meat flavorings and tenderizers. Bouillon cubes. Hot sauce and Tabasco sauce. Premade or packaged marinades. Premade or packaged taco seasonings. Relishes. Regular salad dressings. Where to find more information:  National Heart, Lung, and [place]: https://wilson-eaton.com/  American Heart Association: www.heart.org Summary  The DASH eating plan is a healthy eating plan that has been shown to reduce high blood pressure (hypertension). It may also reduce your risk for type 2 diabetes, heart disease, and stroke.  With the DASH eating plan, you should limit salt (sodium) intake to 2,300 mg a day. If you have hypertension, you may need to reduce your sodium intake to 1,500 mg a day.  When on the DASH eating plan, aim to eat more fresh fruits and vegetables, whole grains, lean proteins, low-fat dairy, and heart-healthy fats.  Work with your health  care provider or diet and nutrition specialist (dietitian) to adjust your eating plan to your individual calorie needs. This information is not intended to replace advice given to you by your health care provider. Make sure you discuss any questions you have with your health care provider. Document Released: 04/12/2011 Document Revised: 04/16/2016 Document Reviewed: 04/16/2016 Elsevier Interactive Patient Education  2019 Elsevier  Inc.  Prediabetes Eating Plan Prediabetes is a condition that causes blood sugar (glucose) levels to be higher than normal. This increases the risk for developing diabetes. In order to prevent diabetes from developing, your health care provider may recommend a diet and other lifestyle changes to help you:  Control your blood glucose levels.  Improve your cholesterol levels.  Manage your blood pressure. Your health care provider may recommend working with a diet and nutrition specialist (dietitian) to make a meal plan that is best for you. What are tips for following this plan? Lifestyle  Set weight loss goals with the help of your health care team. It is recommended that most people with prediabetes lose 7% of their current body weight.  Exercise for at least 30 minutes at least 5 days a week.  Attend a support group or seek ongoing support from a mental health counselor.  Take over-the-counter and prescription medicines only as told by your health care provider. Reading food labels  Read food labels to check the amount of fat, salt (sodium), and sugar in prepackaged foods. Avoid foods that have: ? Saturated fats. ? Trans fats. ? Added sugars.  Avoid foods that have more than 300 milligrams (mg) of sodium per serving. Limit your daily sodium intake to less than 2,300 mg each day. Shopping  Avoid buying pre-made and processed foods. Cooking  Cook with olive oil. Do not use butter, lard, or ghee.  Bake, broil, grill, or boil foods. Avoid frying. Meal planning   Work with your dietitian to develop an eating plan that is right for you. This may include: ? Tracking how many calories you take in. Use a food diary, notebook, or mobile application to track what you eat at each meal. ? Using the glycemic index (GI) to plan your meals. The index tells you how quickly a food will raise your blood glucose. Choose low-GI foods. These foods take a longer time to raise blood  glucose.  Consider following a Mediterranean diet. This diet includes: ? Several servings each day of fresh fruits and vegetables. ? Eating fish at least twice a week. ? Several servings each day of whole grains, beans, nuts, and seeds. ? Using olive oil instead of other fats. ? Moderate alcohol consumption. ? Eating small amounts of red meat and whole-fat dairy.  If you have high blood pressure, you may need to limit your sodium intake or follow a diet such as the DASH eating plan. DASH is an eating plan that aims to lower high blood pressure. What foods are recommended? The items listed below may not be a complete list. Talk with your dietitian about what dietary choices are best for you. Grains Whole grains, such as whole-wheat or whole-grain breads, crackers, cereals, and pasta. Unsweetened oatmeal. Bulgur. Barley. Quinoa. Brown rice. Corn or whole-wheat flour tortillas or taco shells. Vegetables Lettuce. Spinach. Peas. Beets. Cauliflower. Cabbage. Broccoli. Carrots. Tomatoes. Squash. Eggplant. Herbs. Peppers. Onions. Cucumbers. Brussels sprouts. Fruits Berries. Bananas. Apples. Oranges. Grapes. Papaya. Mango. Pomegranate. Kiwi. Grapefruit. Cherries. Meats and other protein foods Seafood. Poultry without skin. Lean cuts of pork and beef. Tofu. Eggs.  Nuts. Beans. Dairy Low-fat or fat-free dairy products, such as yogurt, cottage cheese, and cheese. Beverages Water. Tea. Coffee. Sugar-free or diet soda. Seltzer water. Lowfat or no-fat milk. Milk alternatives, such as soy or almond milk. Fats and oils Olive oil. Canola oil. Sunflower oil. Grapeseed oil. Avocado. Walnuts. Sweets and desserts Sugar-free or low-fat pudding. Sugar-free or low-fat ice cream and other frozen treats. Seasoning and other foods Herbs. Sodium-free spices. Mustard. Relish. Low-fat, low-sugar ketchup. Low-fat, low-sugar barbecue sauce. Low-fat or fat-free mayonnaise. What foods are not recommended? The items  listed below may not be a complete list. Talk with your dietitian about what dietary choices are best for you. Grains Refined white flour and flour products, such as bread, pasta, snack foods, and cereals. Vegetables Canned vegetables. Frozen vegetables with butter or cream sauce. Fruits Fruits canned with syrup. Meats and other protein foods Fatty cuts of meat. Poultry with skin. Breaded or fried meat. Processed meats. Dairy Full-fat yogurt, cheese, or milk. Beverages Sweetened drinks, such as sweet iced tea and soda. Fats and oils Butter. Lard. Ghee. Sweets and desserts Baked goods, such as cake, cupcakes, pastries, cookies, and cheesecake. Seasoning and other foods Spice mixes with added salt. Ketchup. Barbecue sauce. Mayonnaise. Summary  To prevent diabetes from developing, you may need to make diet and other lifestyle changes to help control blood sugar, improve cholesterol levels, and manage your blood pressure.  Set weight loss goals with the help of your health care team. It is recommended that most people with prediabetes lose 7 percent of their current body weight.  Consider following a Mediterranean diet that includes plenty of fresh fruits and vegetables, whole grains, beans, nuts, seeds, fish, lean meat, low-fat dairy, and healthy oils. This information is not intended to replace advice given to you by your health care provider. Make sure you discuss any questions you have with your health care provider. Document Released: 09/07/2014 Document Revised: 06/27/2016 Document Reviewed: 06/27/2016 Elsevier Interactive Patient Education  2019 [employer].

## 2018-09-21 ENCOUNTER — Encounter: Payer: Self-pay | Admitting: Family Medicine

## 2018-10-15 ENCOUNTER — Other Ambulatory Visit: Payer: Self-pay

## 2018-10-15 ENCOUNTER — Encounter: Payer: Self-pay | Admitting: Family Medicine

## 2018-10-15 ENCOUNTER — Ambulatory Visit: Payer: Self-pay | Attending: Family Medicine | Admitting: Family Medicine

## 2018-10-15 VITALS — BP 135/96 | HR 89 | Temp 98.5°F | Ht 64.0 in | Wt 221.4 lb

## 2018-10-15 DIAGNOSIS — R7303 Prediabetes: Secondary | ICD-10-CM | POA: Insufficient documentation

## 2018-10-15 DIAGNOSIS — R682 Dry mouth, unspecified: Secondary | ICD-10-CM

## 2018-10-15 DIAGNOSIS — I10 Essential (primary) hypertension: Secondary | ICD-10-CM | POA: Insufficient documentation

## 2018-10-15 DIAGNOSIS — Z79899 Other long term (current) drug therapy: Secondary | ICD-10-CM

## 2018-10-15 MED ORDER — LOSARTAN POTASSIUM 50 MG PO TABS: 50.0000 mg | ORAL_TABLET | Freq: Every day | ORAL | 3 refills | Status: DC

## 2018-10-15 MED ORDER — METFORMIN HCL 500 MG PO TABS: ORAL_TABLET | ORAL | 1 refills | Status: DC

## 2018-10-15 MED FILL — metFORMIN HCL 500 MG TABS: 500 | 30 days supply | Qty: 30 | Fill #1

## 2018-10-15 MED FILL — LOSARTAN POTASSIUM 50 MG TA: 50 | 30 days supply | Qty: 30 | Fill #1

## 2018-10-15 NOTE — Progress Notes (Signed)
HTN

## 2018-10-15 NOTE — Progress Notes (Signed)
Established Patient Office Visit  Subjective:  Patient ID: Hailey Barry, female    DOB: 06/07/1960  Age: 58 y.o. MRN: [id number]  CC:  Chief Complaint  Patient presents with  . Hypertension    HPI Hailey Barry presents for follow-up of hypertension and prediabetes. She reports that her blood pressure at home has been good since she started the use of losartan. She measures her blood pressure at home and her monitor has a green symbol when her BP is in a normal range but otherwise it would be yellow or red if high. She states that the monitor has been in the green range. She forgot to bring the monitor with her. She is also taking the metformin for her blood sugars. She does not have a monitor and does not check her home blood sugars. Since being on the medications she has had a really dry mouth especially at night that she thinks is related to the blood pressure medication however she admits that she had issues with a dry mouth before starting either medication.        She is taking the metformin as well but has also developed issues with both nausea/acid reflux and has started taking a medication for acid reflux-pepcid which is helping as well as an over the counter medication to help with gas that is also effective. She has been drinking more water because of her dry mouth but then she has to get up at night to urinate. She denies any issues with peripheral edema. No blurred vision. No headaches or dizziness. No chest pain or palpitations.  Past Medical History:  Diagnosis Date  . Hypertension   . Prediabetes     Past Surgical History:  Procedure Laterality Date  . CESAREAN SECTION      Family History  Problem Relation Age of Onset  . Diabetes Mother   . Hypertension Mother   . Diabetes Father   . Hypertension Maternal Grandmother     Social History   Tobacco Use  . Smoking status: Former Smoker    Types: Cigarettes  . Smokeless tobacco: Never Used  Substance  Use Topics  . Alcohol use: Never    Frequency: Never  . Drug use: Yes    Types: Marijuana    Outpatient Medications Prior to Visit  Medication Sig Dispense Refill  . cetirizine (ZYRTEC) 10 MG tablet Take 10 mg by mouth daily.    . famotidine (PEPCID) 20 MG tablet Take 20 mg by mouth at bedtime.    Marland Kitchen ibuprofen (ADVIL) 200 MG tablet Take 400 mg by mouth every 6 (six) hours as needed for headache or moderate pain.    . Multiple Vitamin (MULTIVITAMIN WITH MINERALS) TABS tablet Take 1 tablet by mouth daily.    . naproxen sodium (ALEVE) 220 MG tablet Take 220 mg by mouth 2 (two) times daily as needed (pain/headache).    . NON FORMULARY Equate Gas and Bloating Prevention    . losartan (COZAAR) 50 MG tablet Take 1 tablet (50 mg total) by mouth daily. To lower blood pressure 30 tablet 3  . metFORMIN (GLUCOPHAGE) 500 MG tablet One pill daily after the evening meal 90 tablet 1  . cephALEXin (KEFLEX) 500 MG capsule Take 1 capsule (500 mg total) by mouth 4 (four) times daily. (Patient not taking: Reported on 09/08/2018) 28 capsule 0  . dicyclomine (BENTYL) 20 MG tablet Take 1 tablet (20 mg total) by mouth 4 (four) times daily -  before meals and at  bedtime for 5 days. (Patient not taking: Reported on 09/17/2018) 20 tablet 0  . loperamide (IMODIUM) 2 MG capsule Take 1 capsule (2 mg total) by mouth 4 (four) times daily as needed for diarrhea or loose stools. (Patient not taking: Reported on 09/17/2018) 12 capsule 0  . ondansetron (ZOFRAN ODT) 4 MG disintegrating tablet Take 1 tablet (4 mg total) by mouth every 8 (eight) hours as needed for nausea or vomiting. (Patient not taking: Reported on 09/17/2018) 15 tablet 0   No facility-administered medications prior to visit.     No Known Allergies  ROS Review of Systems  Constitutional: Negative for chills, fatigue and fever.  HENT: Negative for sore throat and trouble swallowing.   Eyes: Negative for photophobia and visual disturbance.  Respiratory: Negative  for cough and shortness of breath.   Cardiovascular: Negative for chest pain, palpitations and leg swelling.  Gastrointestinal: Positive for nausea. Negative for abdominal pain, constipation and diarrhea.  Endocrine: Positive for polydipsia and polyuria.  Genitourinary: Positive for frequency. Negative for dysuria.  Musculoskeletal: Negative for arthralgias and back pain.  Neurological: Negative for dizziness and headaches.  Hematological: Negative for adenopathy. Does not bruise/bleed easily.      Objective:    Physical Exam  Constitutional: She is oriented to person, place, and time. She appears well-developed and well-nourished.  Overweight for height/obese older female in NAD; slightly exopthalmic appearance to her eyes  Neck: Normal range of motion. Neck supple.  Cardiovascular: Normal rate and regular rhythm.  Pulmonary/Chest: Effort normal and breath sounds normal.  Abdominal: Soft. There is no abdominal tenderness. There is no rebound and no guarding.  Musculoskeletal:        General: No tenderness or edema.  Lymphadenopathy:    She has no cervical adenopathy.  Neurological: She is alert and oriented to person, place, and time.  Skin: Skin is warm and dry.  Psychiatric: She has a normal mood and affect. Her behavior is normal. Judgment and thought content normal.  Nursing note and vitals reviewed.   BP (!) 135/96 (BP Location: Right Arm, Patient Position: Sitting, Cuff Size: Large)   Pulse 89   Temp 98.5 F (36.9 C) (Oral)   Ht 5\' 4"  (1.626 m)   Wt 221 lb 6.4 oz (100.4 kg)   SpO2 95%   BMI 38.00 kg/m  Wt Readings from Last 3 Encounters:  10/15/18 221 lb 6.4 oz (100.4 kg)  09/17/18 214 lb 6.4 oz (97.3 kg)  05/01/18 212 lb 3.2 oz (96.3 kg)     Health Maintenance Due  Topic Date Due  . Hepatitis C Screening  1960/12/29  . HIV Screening  07/26/1975  . TETANUS/TDAP  07/26/1979  . PAP SMEAR-Modifier  07/25/1981  . MAMMOGRAM  07/26/2010  . COLONOSCOPY  07/26/2010     There are no preventive care reminders to display for this patient.  No results found for: TSH Lab Results  Component Value Date   WBC 3.4 (L) 09/08/2018   HGB 14.9 09/08/2018   HCT 43.8 09/08/2018   MCV 92.2 09/08/2018   PLT 269 09/08/2018   Lab Results  Component Value Date   NA 137 09/08/2018   K 3.5 09/08/2018   CO2 23 09/08/2018   GLUCOSE 131 (H) 09/08/2018   BUN 11 09/08/2018   CREATININE 0.79 09/08/2018   BILITOT 0.6 09/08/2018   ALKPHOS 70 09/08/2018   AST 41 09/08/2018   ALT 41 09/08/2018   PROT 7.6 09/08/2018   ALBUMIN 3.8 09/08/2018   CALCIUM  8.8 (L) 09/08/2018   ANIONGAP 10 09/08/2018   No results found for: CHOL No results found for: HDL No results found for: LDLCALC No results found for: TRIG No results found for: [facility] Lab Results  Component Value Date   HGBA1C 6.1 (A) 09/17/2018      Assessment & Plan:  1. Essential hypertension BP is improved at today's visit. Continue to monitor blood pressure and follow a DASH diet.  - losartan (COZAAR) 50 MG tablet; Take 1 tablet (50 mg total) by mouth daily. To lower blood pressure  Dispense: 30 tablet; Refill: 3  2. Prediabetes Patient with Hgb A1c of 6.1 on 09/17/2018 and she has started the use of metformin. She will have BMP today to see if her blood sugar might be elevated contributing to her thirst and urinary frequency. Continue a healthy diet and regular exercise - metFORMIN (GLUCOPHAGE) 500 MG tablet; One pill daily after the evening meal  Dispense: 90 tablet; Refill: 1  3. Dry mouth Discussed with patient that she should try an over the counter spray/gel to help with her dry mouth but if this is not helping then changes may need to be made in her medications. Because the dry mouth started prior to her use of medication it may also be related to her blood sugar and patient will have a BMP done at today's visit.   4. Encounter for long-term (current) use of medications BMP in follow-up of the use  of medication for the treatment of DM and Hypertension   Meds ordered this encounter  Medications  . losartan (COZAAR) 50 MG tablet    Sig: Take 1 tablet (50 mg total) by mouth daily. To lower blood pressure    Dispense:  30 tablet    Refill:  3  . metFORMIN (GLUCOPHAGE) 500 MG tablet    Sig: One pill daily after the evening meal    Dispense:  90 tablet    Refill:  1    Follow-up: Return in about 4 months (around 02/14/2019) for DM/prediabetes- sooner if needed.    Cain Saupeammie Kendre Sires, MD

## 2018-11-27 MED FILL — metFORMIN HCL 500 MG TABS: 500 | 30 days supply | Qty: 30 | Fill #2

## 2018-11-27 MED FILL — LOSARTAN POTASSIUM 50 MG TA: 50 | 30 days supply | Qty: 30 | Fill #2

## 2019-01-06 MED FILL — LOSARTAN POTASSIUM 50 MG TA: 50 | 30 days supply | Qty: 30 | Fill #3

## 2019-01-06 MED FILL — metFORMIN HCL 500 MG TABS: 500 | 30 days supply | Qty: 30 | Fill #3

## 2019-01-19 ENCOUNTER — Encounter: Payer: Self-pay | Admitting: Family Medicine

## 2019-01-19 ENCOUNTER — Other Ambulatory Visit: Payer: Self-pay | Admitting: Family Medicine

## 2019-01-19 DIAGNOSIS — R7303 Prediabetes: Secondary | ICD-10-CM

## 2019-01-19 DIAGNOSIS — I10 Essential (primary) hypertension: Secondary | ICD-10-CM

## 2019-01-19 MED ORDER — LOSARTAN POTASSIUM 50 MG PO TABS: 50.0000 mg | ORAL_TABLET | Freq: Every day | ORAL | 1 refills | Status: DC

## 2019-01-19 MED ORDER — METFORMIN HCL 500 MG PO TABS: ORAL_TABLET | ORAL | 0 refills | Status: DC

## 2019-01-19 MED FILL — metFORMIN HCL 500 MG TABS: 500 | 30 days supply | Qty: 30 | Fill #3

## 2019-01-19 MED FILL — LOSARTAN POTASSIUM 50 MG TA: 50 | 30 days supply | Qty: 30 | Fill #3

## 2019-01-19 NOTE — Progress Notes (Signed)
Patient ID: Hailey Barry, female   DOB: [birth year], 58 y.o.   MRN: [id number]   Patient left message that my chart requesting medication refills be sent to [facility] instead of [facility] as she states that she has had difficulty obtaining medicines from [facility] and that the pharmacy hours and her work hours conflict.

## 2019-01-19 NOTE — Telephone Encounter (Signed)
Patient would like medications sent to [facility] instead of [facility] due to hours of operation.

## 2019-02-16 ENCOUNTER — Ambulatory Visit: Payer: Self-pay | Admitting: Family Medicine

## 2019-03-20 ENCOUNTER — Other Ambulatory Visit: Payer: Self-pay

## 2019-03-20 ENCOUNTER — Ambulatory Visit: Payer: Self-pay | Attending: Internal Medicine | Admitting: Internal Medicine

## 2019-03-20 DIAGNOSIS — M5416 Radiculopathy, lumbar region: Secondary | ICD-10-CM

## 2019-03-20 MED ORDER — METHOCARBAMOL 500 MG PO TABS: 500.0000 mg | ORAL_TABLET | Freq: Three times a day (TID) | ORAL | 0 refills | Status: DC | PRN

## 2019-03-20 MED ORDER — PREDNISONE 20 MG PO TABS: ORAL_TABLET | ORAL | 0 refills | Status: DC

## 2019-03-20 MED FILL — metFORMIN HCL 500 MG TABS: 500 | 30 days supply | Qty: 30 | Fill #4

## 2019-03-20 MED FILL — predniSONE 20 MG TABS: 20 | 6 days supply | Qty: 5 | Fill #0

## 2019-03-20 MED FILL — METHOCARBAMOL 500 MG TABS: 500 | 10 days supply | Qty: 30 | Fill #0

## 2019-03-20 NOTE — Progress Notes (Signed)
Virtual Visit via Telephone Note Due to current restrictions/limitations of in-office visits due to the COVID-19 pandemic, this scheduled clinical appointment was converted to a telehealth visit  I connected with Hailey Barry on 03/20/19 at 8:51 a.m by telephone and verified that I am speaking with the correct person using two identifiers. I am in my office.  The patient is at home.  Only the patient and myself participated in this encounter.  I discussed the limitations, risks, security and privacy concerns of performing an evaluation and management service by telephone and the availability of in person appointments. I also discussed with the patient that there may be a patient responsible charge related to this service. The patient expressed understanding and agreed to proceed.   History of Present Illness: Patient with history of prediabetes and HTN.  PCP is Dr. Chapman Fitch.  Patient complains of acute lower back pain that started 5 days ago.  Patient states she was standing on a step up stool taking some boxes down when she twisted her back to the left.  Ever since then she has been having intense pain in the lower back.  Pain radiates down the right leg.  Some numbness in both thighs posteriorly when she sits down.  Denies any weakness, incontinence or saddle anesthesia.  Pain is worse when she is up moving around.  She also notes that when she drives and presses on the pedal that is a sharp pain that shoots up her right leg to the lower back.  She reports having similar back pain a few years ago that responded well to Robaxin.  She is requesting a prescription for that.  She has been taking ibuprofen and Goody's powder without much improvement.   Outpatient Encounter Medications as of 03/20/2019  Medication Sig  . cephALEXin (KEFLEX) 500 MG capsule Take 1 capsule (500 mg total) by mouth 4 (four) times daily. (Patient not taking: Reported on 09/08/2018)  . cetirizine (ZYRTEC) 10 MG tablet Take 10  mg by mouth daily.  Marland Kitchen dicyclomine (BENTYL) 20 MG tablet Take 1 tablet (20 mg total) by mouth 4 (four) times daily -  before meals and at bedtime for 5 days. (Patient not taking: Reported on 09/17/2018)  . famotidine (PEPCID) 20 MG tablet Take 20 mg by mouth at bedtime.  Marland Kitchen ibuprofen (ADVIL) 200 MG tablet Take 400 mg by mouth every 6 (six) hours as needed for headache or moderate pain.  Marland Kitchen loperamide (IMODIUM) 2 MG capsule Take 1 capsule (2 mg total) by mouth 4 (four) times daily as needed for diarrhea or loose stools. (Patient not taking: Reported on 09/17/2018)  . losartan (COZAAR) 50 MG tablet Take 1 tablet (50 mg total) by mouth daily. To lower blood pressure  . metFORMIN (GLUCOPHAGE) 500 MG tablet One pill daily after the evening meal  . Multiple Vitamin (MULTIVITAMIN WITH MINERALS) TABS tablet Take 1 tablet by mouth daily.  . naproxen sodium (ALEVE) 220 MG tablet Take 220 mg by mouth 2 (two) times daily as needed (pain/headache).  . NON FORMULARY Equate Gas and Bloating Prevention  . ondansetron (ZOFRAN ODT) 4 MG disintegrating tablet Take 1 tablet (4 mg total) by mouth every 8 (eight) hours as needed for nausea or vomiting. (Patient not taking: Reported on 09/17/2018)   No facility-administered encounter medications on file as of 03/20/2019.       Observations/Objective: No direct observation done as this was a telephone encounter  Assessment and Plan: 1. Lumbar radiculopathy, acute Recommend no lifting, pushing or  pulling for the next 2 weeks. Recommend using a heating pad when she is sitting or lying down as needed. We will prescribe prednisone for 5 days along with some Robaxin to use as needed.  Advised that Robaxin can cause drowsiness. - methocarbamol (ROBAXIN) 500 MG tablet; Take 1 tablet (500 mg total) by mouth every 8 (eight) hours as needed for muscle spasms.  Dispense: 30 tablet; Refill: 0 - predniSONE (DELTASONE) 20 MG tablet; 1 tab PO daily x 3 days then 1/2 tab PO daily x 3  days  Dispense: 5 tablet; Refill: 0   Follow Up Instructions: As needed if no improvement   I discussed the assessment and treatment plan with the patient. The patient was provided an opportunity to ask questions and all were answered. The patient agreed with the plan and demonstrated an understanding of the instructions.   The patient was advised to call back or seek an in-person evaluation if the symptoms worsen or if the condition fails to improve as anticipated.  I provided 7.5 minutes of non-face-to-face time during this encounter.   Jonah Blue, MD

## 2019-04-06 ENCOUNTER — Telehealth: Payer: Self-pay | Admitting: *Deleted

## 2019-04-06 NOTE — Telephone Encounter (Signed)
LMOM for patient to call office due to her previous request to schedule appt for pre-existing back back. Patient also states she is having stomach pain per her mychart message. Office number was left on patient voicemail to call office back. Staff will reply to patient via her mychart informing her to all office to schedule appt.

## 2019-04-09 ENCOUNTER — Ambulatory Visit: Payer: Self-pay | Attending: Family Medicine | Admitting: Physician Assistant

## 2019-04-09 ENCOUNTER — Other Ambulatory Visit: Payer: Self-pay

## 2019-04-09 VITALS — BP 164/108 | HR 95 | Temp 98.2°F | Ht 64.0 in | Wt 226.6 lb

## 2019-04-09 DIAGNOSIS — R7303 Prediabetes: Secondary | ICD-10-CM

## 2019-04-09 DIAGNOSIS — I10 Essential (primary) hypertension: Secondary | ICD-10-CM

## 2019-04-09 DIAGNOSIS — R8281 Pyuria: Secondary | ICD-10-CM

## 2019-04-09 DIAGNOSIS — R198 Other specified symptoms and signs involving the digestive system and abdomen: Secondary | ICD-10-CM

## 2019-04-09 DIAGNOSIS — R109 Unspecified abdominal pain: Secondary | ICD-10-CM

## 2019-04-09 DIAGNOSIS — M5416 Radiculopathy, lumbar region: Secondary | ICD-10-CM

## 2019-04-09 LAB — POCT URINALYSIS DIP (CLINITEK)
Bilirubin, UA: NEGATIVE
Blood, UA: NEGATIVE
Glucose, UA: NEGATIVE mg/dL
Ketones, POC UA: NEGATIVE mg/dL
Nitrite, UA: NEGATIVE
POC PROTEIN,UA: NEGATIVE
Spec Grav, UA: >=1.03 — AB (ref 1.010–1.025)
Urobilinogen, UA: 0.2 E.U./dL
pH, UA: 5 (ref 5.0–8.0)

## 2019-04-09 LAB — POCT GLYCOSYLATED HEMOGLOBIN (HGB A1C): HbA1c, POC (prediabetic range): 5.8 % (ref 5.7–6.4)

## 2019-04-09 LAB — GLUCOSE, POCT (MANUAL RESULT ENTRY): POC Glucose: 85 mg/dl (ref 70–99)

## 2019-04-09 MED ORDER — METFORMIN HCL 500 MG PO TABS: 500.0000 mg | ORAL_TABLET | Freq: Two times a day (BID) | ORAL | 3 refills | Status: DC

## 2019-04-09 MED ORDER — METHOCARBAMOL 500 MG PO TABS: 1000.0000 mg | ORAL_TABLET | Freq: Three times a day (TID) | ORAL | 0 refills | Status: AC | PRN

## 2019-04-09 MED ORDER — LOSARTAN POTASSIUM 50 MG PO TABS: 50.0000 mg | ORAL_TABLET | Freq: Every day | ORAL | 3 refills | Status: AC

## 2019-04-09 MED ORDER — SULFAMETHOXAZOLE-TRIMETHOPRIM 800-160 MG PO TABS: 1.0000 | ORAL_TABLET | Freq: Two times a day (BID) | ORAL | 0 refills | Status: AC

## 2019-04-09 MED ORDER — METHYLPREDNISOLONE SODIUM SUCC 125 MG IJ SOLR
125.0000 mg | Freq: Once | INTRAMUSCULAR | Status: AC
  Administered 2019-04-09: 125 mg via INTRAMUSCULAR

## 2019-04-09 MED ORDER — METFORMIN HCL 500 MG PO TABS: ORAL_TABLET | ORAL | 3 refills | Status: AC

## 2019-04-09 MED ORDER — PREDNISONE 20 MG PO TABS: ORAL_TABLET | ORAL | 0 refills | Status: AC

## 2019-04-09 MED FILL — METHOCARBAMOL 500 MG TABS: 500 | 15 days supply | Qty: 90 | Fill #0

## 2019-04-09 MED FILL — metFORMIN HCL 500 MG TABS: 500 | 30 days supply | Qty: 30 | Fill #0

## 2019-04-09 MED FILL — LOSARTAN POTASSIUM 50 MG TA: 50 | 30 days supply | Qty: 30 | Fill #0

## 2019-04-09 MED FILL — predniSONE 20 MG TABS: 20 | 9 days supply | Qty: 18 | Fill #0

## 2019-04-09 MED FILL — SULFAMETHOXAZOLE-TMP DS TAB: 800-160 | 3 days supply | Qty: 6 | Fill #0

## 2019-04-09 NOTE — Progress Notes (Signed)
Patient ID: Hailey Barry, female   DOB: December 01, 1960, 58 y.o.   MRN: [id number]   Hailey Barry, is a 58 y.o. female  XLK:[id number]  [id number]  DOB - Jun 22, 1960  Subjective:  Chief Complaint and HPI: Hailey Barry is a 58 y.o. female here today with continued LBP and R leg radiculopathy.  Short course of prednisone seemed to start helping but not really.  Pain started about 3 weeks ago when she was standing on a stool in her kitchen reaching for something on a top shelf.  No dysuria.  Also some pain in her stomach.  Pain is better after BM.  No recent constipation/diarrhea.  No melena/hematochezia. She has to drive to [place] in 1 week/.  She is worried something serious is going on.  No fever.  Pain worse with walking and moving R leg  Out of BP meds.  Still has some metformin.  Her 59 yr old schizophrenic sister died recently.  She has to drive to [place] for estate issues.    ROS:   Constitutional:  No f/c, No night sweats, No unexplained weight loss. EENT:  No vision changes, No blurry vision, No hearing changes. No mouth, throat, or ear problems.  Respiratory: No cough, No SOB Cardiac: No CP, no palpitations GI:  + abd pain, No N/V/D. GU: No Urinary s/sx Musculoskeletal: see above Neuro: No headache, no dizziness, no motor weakness.  Skin: No rash Endocrine:  No polydipsia. No polyuria.  Psych: Denies SI/HI  No problems updated.  ALLERGIES: No Known Allergies  PAST MEDICAL HISTORY: Past Medical History:  Diagnosis Date  . Hypertension   . Prediabetes     MEDICATIONS AT HOME: Prior to Admission medications   Medication Sig Start Date End Date Taking? Authorizing Provider  losartan (COZAAR) 50 MG tablet Take 1 tablet (50 mg total) by mouth daily. To lower blood pressure 04/09/19  Yes Argentina Donovan, PA-C  metFORMIN (GLUCOPHAGE) 500 MG tablet One pill daily after the evening meal 04/09/19  Yes Adonis Yim M, PA-C  cephALEXin (KEFLEX) 500 MG  capsule Take 1 capsule (500 mg total) by mouth 4 (four) times daily. Patient not taking: Reported on 09/08/2018 05/01/18   Ashley Murrain, NP  cetirizine (ZYRTEC) 10 MG tablet Take 10 mg by mouth daily.    [provider]  dicyclomine (BENTYL) 20 MG tablet Take 1 tablet (20 mg total) by mouth 4 (four) times daily -  before meals and at bedtime for 5 days. Patient not taking: Reported on 09/17/2018 09/08/18 09/13/18  Duffy Bruce, MD  famotidine (PEPCID) 20 MG tablet Take 20 mg by mouth at bedtime.    [provider]  ibuprofen (ADVIL) 200 MG tablet Take 400 mg by mouth every 6 (six) hours as needed for headache or moderate pain.    [provider]  loperamide (IMODIUM) 2 MG capsule Take 1 capsule (2 mg total) by mouth 4 (four) times daily as needed for diarrhea or loose stools. Patient not taking: Reported on 09/17/2018 09/08/18   Duffy Bruce, MD  methocarbamol (ROBAXIN) 500 MG tablet Take 2 tablets (1,000 mg total) by mouth every 8 (eight) hours as needed for muscle spasms. 04/09/19   Argentina Donovan, PA-C  Multiple Vitamin (MULTIVITAMIN WITH MINERALS) TABS tablet Take 1 tablet by mouth daily.    [provider]  naproxen sodium (ALEVE) 220 MG tablet Take 220 mg by mouth 2 (two) times daily as needed (pain/headache).    [provider]  Baruch Gouty  Equate Gas and Bloating Prevention    [provider]  ondansetron (ZOFRAN ODT) 4 MG disintegrating tablet Take 1 tablet (4 mg total) by mouth every 8 (eight) hours as needed for nausea or vomiting. Patient not taking: Reported on 09/17/2018 09/08/18   Shaune Pollack, MD  predniSONE (DELTASONE) 20 MG tablet 3,3,3,2,2,2,1,1,1 04/09/19   Anders Simmonds, PA-C  sulfamethoxazole-trimethoprim (BACTRIM DS) 800-160 MG tablet Take 1 tablet by mouth 2 (two) times daily. 04/09/19   Anders Simmonds, PA-C     Objective:  EXAM:   Vitals:   04/09/19 1553  BP: (!) 164/108  Pulse: 95  Temp: 98.2 F (36.8 C)   TempSrc: Oral  SpO2: 96%  Weight: 226 lb 9.6 oz (102.8 kg)  Height: 5\' 4"  (1.626 m)    General appearance : A&OX3. NAD. Non-toxic-appearing ambulates with a limp favoring L leg HEENT: Atraumatic and Normocephalic.  PERRLA. EOM intact.  Chest/Lungs:  Breathing-non-labored, Good air entry bilaterally, breath sounds normal without rales, rhonchi, or wheezing  CVS: S1 S2 regular, no murmurs, gallops, rubs  Abdomen: Bowel sounds present, Non tender and not distended with no gaurding, rigidity or rebound. Back: decreased ROM.  +SLR on R.  DTR=intact BLE Extremities: Bilateral Lower Ext shows no edema, both legs are warm to touch with = pulse throughout Neurology:  CN II-XII grossly intact, Non focal.   Psych:  TP linear. J/I WNL. Normal speech. Appropriate eye contact and affect.  Skin:  No Rash  Data Review Lab Results  Component Value Date   HGBA1C 5.8 04/09/2019   HGBA1C 6.1 (A) 09/17/2018     Assessment & Plan   1. Stomach pain Urine shows trace leukocytes.  Septra DS bid x 3 days.  Increase water intake.  Non-acute abdomen.  CT from 09/2018 reviewed  - POCT URINALYSIS DIP (CLINITEK) - Comprehensive metabolic panel - CBC with Differential  2. Lumbar radiculopathy, acute - methocarbamol (ROBAXIN) 500 MG tablet; Take 2 tablets (1,000 mg total) by mouth every 8 (eight) hours as needed for muscle spasms.  Dispense: 90 tablet; Refill: 0 - predniSONE (DELTASONE) 20 MG tablet; 3,3,3,2,2,2,1,1,1  Dispense: 18 tablet; Refill: 0 - Ambulatory referral to Orthopedic Surgery - DG Lumbar Spine Complete; Future - methylPREDNISolone sodium succinate (SOLU-MEDROL) 125 mg/2 mL injection 125 mg -stretch frequently on long drive  3. Prediabetes I have had a lengthy discussion and provided education about insulin resistance and the intake of too much sugar/refined carbohydrates.  I have advised the patient to work at a goal of eliminating sugary drinks, candy, desserts, sweets, refined sugars,  processed foods, and white carbohydrates.  The patient expresses understanding.  - Glucose (CBG) - POCT glycosylated hemoglobin (Hb A1C) - metFORMIN (GLUCOPHAGE) 500 MG tablet; One pill daily after the evening meal  Dispense: 30 tablet; Refill: 3  4. Defecation symptom Pain relief after defecation - DG Abd 1 View; Future  5. Essential hypertension Out of meds-resume meds - Comprehensive metabolic panel - CBC with Differential - losartan (COZAAR) 50 MG tablet; Take 1 tablet (50 mg total) by mouth daily. To lower blood pressure  Dispense: 30 tablet; Refill: 3  6. Pyuria Septra ds BID x3 days.  Increase water intake - Urine culture     Patient have been counseled extensively about nutrition and exercise  Return in about 1 month (around 05/10/2019) for with PCP; follow up back pain; and htn.  The patient was given clear instructions to go to ER or return to medical center if symptoms don't  improve, worsen or new problems develop. The patient verbalized understanding. The patient was told to call to get lab results if they haven't heard anything in the next week.     Georgian CoAngela Dasie Chancellor, PA-C [facility] [place], [place] [phone]   04/09/2019, 4:30 PM

## 2019-04-09 NOTE — Patient Instructions (Signed)
eat less sugar.  drink 8-10 cups water daily

## 2019-04-10 ENCOUNTER — Encounter: Payer: Self-pay | Admitting: Physician Assistant

## 2019-04-10 LAB — COMPREHENSIVE METABOLIC PANEL
ALT: 18 IU/L (ref 0–32)
AST: 15 IU/L (ref 0–40)
Albumin/Globulin Ratio: 1.5 (ref 1.2–2.2)
Albumin: 4.3 g/dL (ref 3.8–4.9)
Alkaline Phosphatase: 67 IU/L (ref 39–117)
BUN/Creatinine Ratio: 16 (ref 9–23)
BUN: 14 mg/dL (ref 6–24)
Bilirubin Total: <0.2 mg/dL (ref 0.0–1.2)
CO2: 25 mmol/L (ref 20–29)
Calcium: 10.1 mg/dL (ref 8.7–10.2)
Chloride: 104 mmol/L (ref 96–106)
Creatinine, Ser: 0.89 mg/dL (ref 0.57–1.00)
GFR calc Af Amer: 83 mL/min/{1.73_m2} (ref >59)
GFR calc non Af Amer: 72 mL/min/{1.73_m2} (ref >59)
Globulin, Total: 2.9 g/dL (ref 1.5–4.5)
Glucose: 88 mg/dL (ref 65–99)
Potassium: 4.3 mmol/L (ref 3.5–5.2)
Sodium: 143 mmol/L (ref 134–144)
Total Protein: 7.2 g/dL (ref 6.0–8.5)

## 2019-04-10 LAB — CBC WITH DIFFERENTIAL/PLATELET
Basophils Absolute: 0.1 10*3/uL (ref 0.0–0.2)
Basos: 1 %
EOS (ABSOLUTE): 0.4 10*3/uL (ref 0.0–0.4)
Eos: 4 %
Hematocrit: 42.5 % (ref 34.0–46.6)
Hemoglobin: 14.6 g/dL (ref 11.1–15.9)
Immature Grans (Abs): 0.1 10*3/uL (ref 0.0–0.1)
Immature Granulocytes: 1 %
Lymphocytes Absolute: 3.7 10*3/uL — ABNORMAL HIGH (ref 0.7–3.1)
Lymphs: 36 %
MCH: 31.7 pg (ref 26.6–33.0)
MCHC: 34.4 g/dL (ref 31.5–35.7)
MCV: 92 fL (ref 79–97)
Monocytes Absolute: 0.7 10*3/uL (ref 0.1–0.9)
Monocytes: 7 %
Neutrophils Absolute: 5.3 10*3/uL (ref 1.4–7.0)
Neutrophils: 51 %
Platelets: 341 10*3/uL (ref 150–450)
RBC: 4.61 x10E6/uL (ref 3.77–5.28)
RDW: 13.5 % (ref 11.7–15.4)
WBC: 10.2 10*3/uL (ref 3.4–10.8)

## 2019-04-11 LAB — URINE CULTURE

## 2019-04-12 ENCOUNTER — Encounter: Payer: Self-pay | Admitting: Physician Assistant

## 2019-04-16 ENCOUNTER — Ambulatory Visit (HOSPITAL_COMMUNITY)
Admission: RE | Admit: 2019-04-16 | Discharge: 2019-04-16 | Disposition: A | Discharge status: Home / Self Care | Payer: Self-pay | Source: Ambulatory Visit | Attending: Physician Assistant | Admitting: Physician Assistant

## 2019-04-16 ENCOUNTER — Other Ambulatory Visit: Payer: Self-pay

## 2019-04-16 DIAGNOSIS — R198 Other specified symptoms and signs involving the digestive system and abdomen: Secondary | ICD-10-CM | POA: Insufficient documentation

## 2019-04-17 ENCOUNTER — Encounter: Payer: Self-pay | Admitting: Physician Assistant

## 2019-05-13 ENCOUNTER — Ambulatory Visit: Payer: Self-pay | Admitting: Nurse Practitioner

## 2019-05-13 ENCOUNTER — Ambulatory Visit: Payer: Self-pay | Admitting: Family Medicine

## 2019-09-08 IMAGING — CT CT ABDOMEN AND PELVIS WITH CONTRAST
2 of 5 series · 15 of 46 positions shown, 17 images · IV contrast (OMNIPAQUE)
Comparison: Right upper quadrant abdominal ultrasound-09/08/2018

CLINICAL DATA: Generalized acute abdominal pain, nausea and
vomiting.

EXAM:
CT ABDOMEN AND PELVIS WITH CONTRAST
TECHNIQUE: Multidetector CT imaging of the abdomen and pelvis was performed
using the standard protocol following bolus administration of
intravenous contrast.
CONTRAST:  100mL OMNIPAQUE IOHEXOL 300 MG/ML  SOLN

[Series 2: axial st · axial · 0.78mm/px · z∈[+1179,+1564]mm · 12 of 89 slices shown, 14 images]
[im 6/89  soft-tissue]
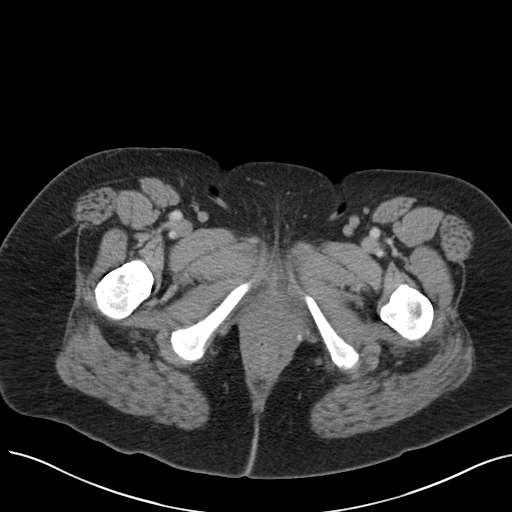
[im 6/89  bone]
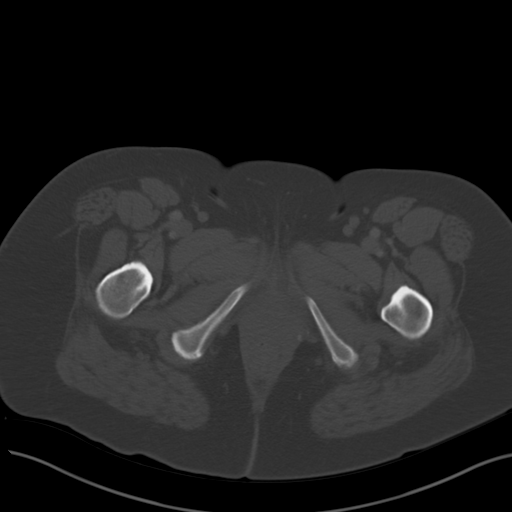
[im 12/89  soft-tissue]
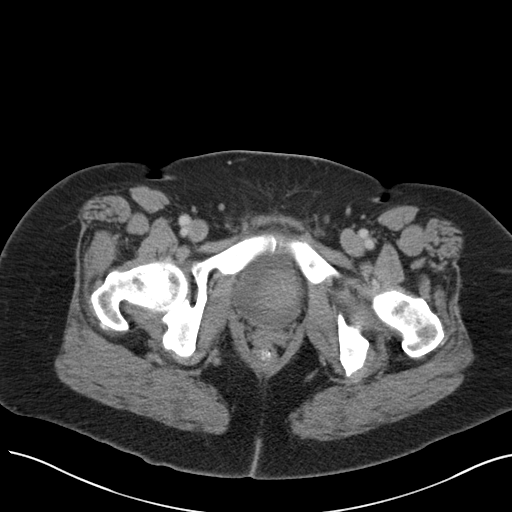
[im 23/89  soft-tissue]
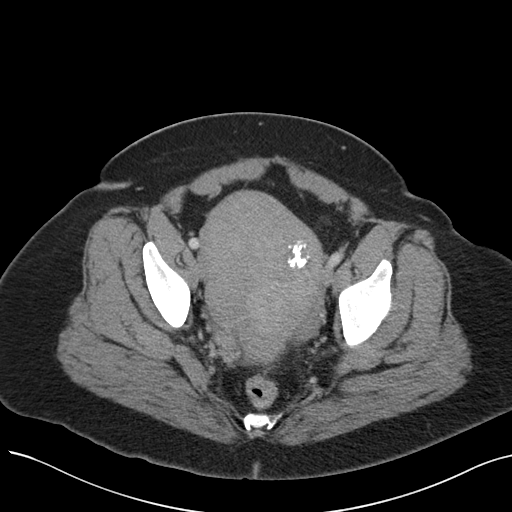
[im 28/89  soft-tissue]
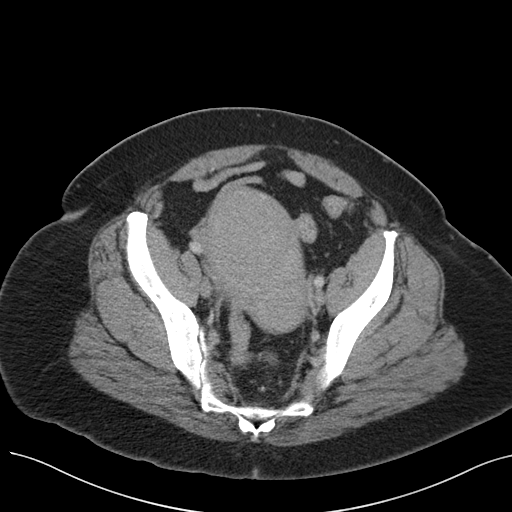
[im 34/89  soft-tissue]
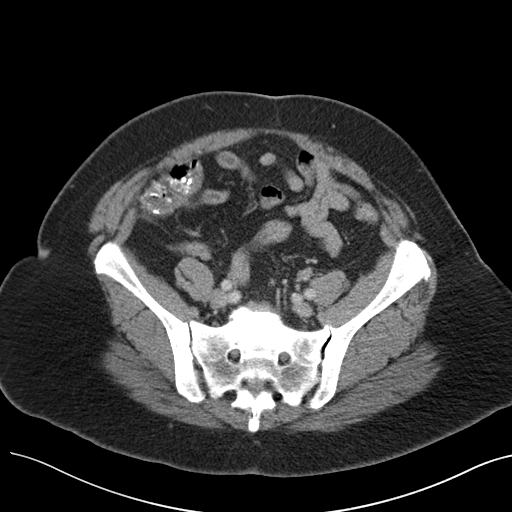
[im 39/89  soft-tissue]
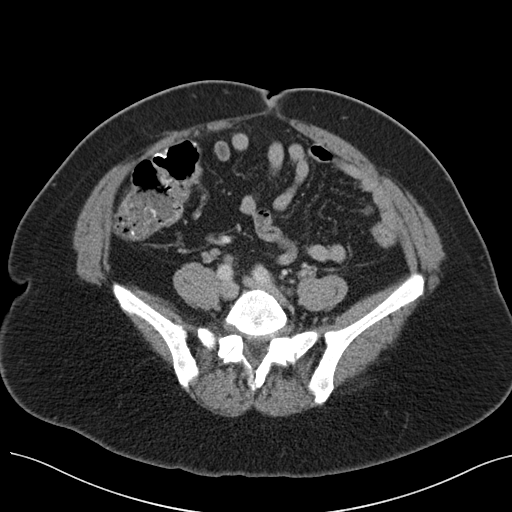
[im 50/89  soft-tissue]
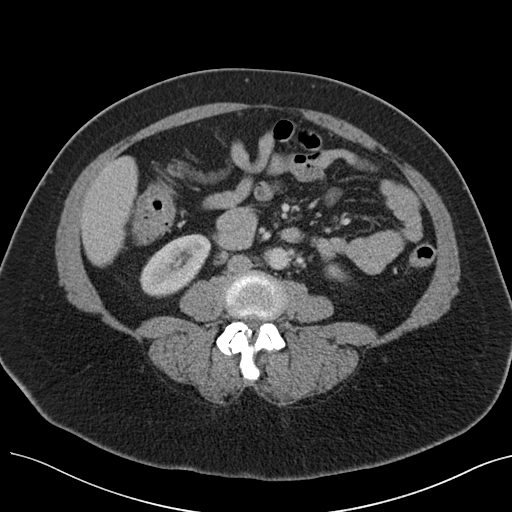
[im 56/89  soft-tissue]
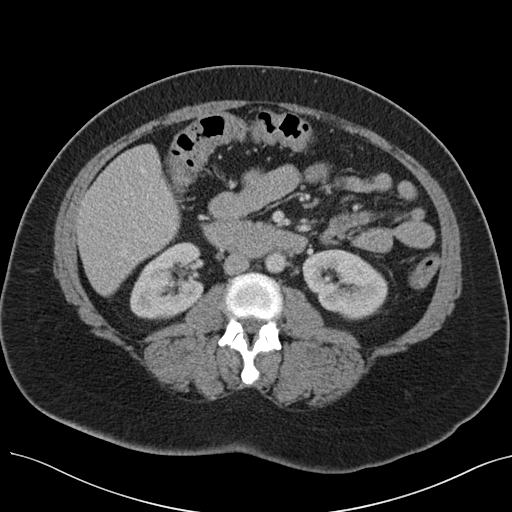
[im 61/89  soft-tissue]
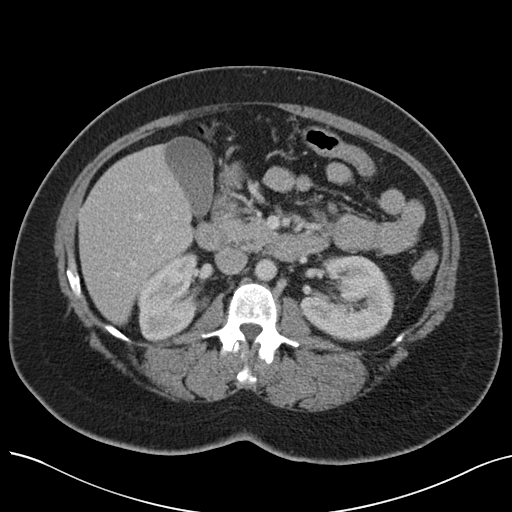
[im 61/89  bone]
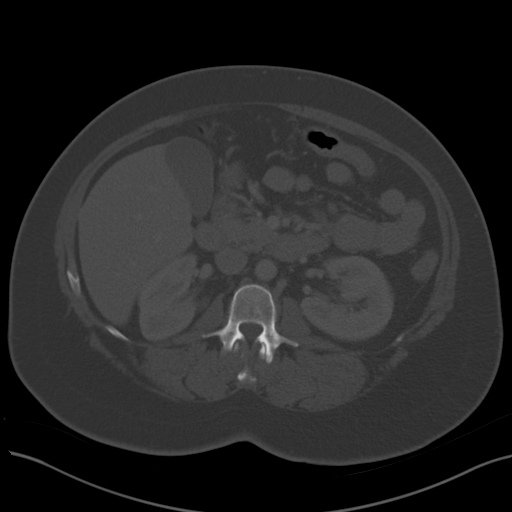
[im 67/89  soft-tissue]
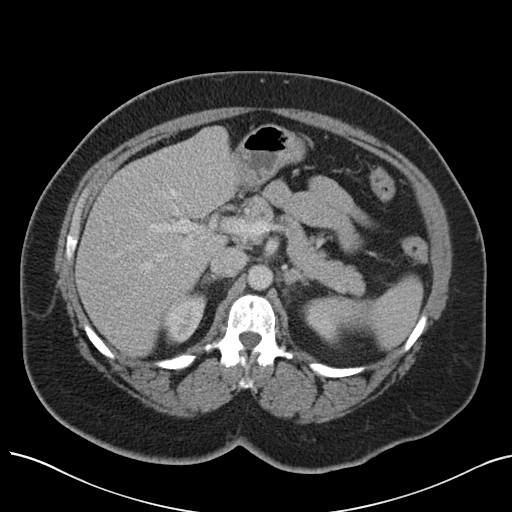
[im 78/89  soft-tissue]
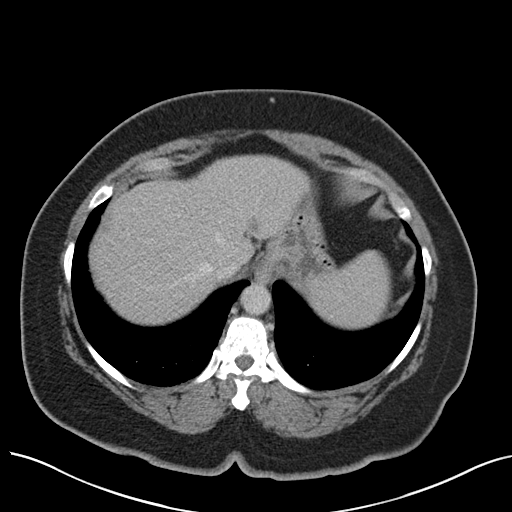
[im 83/89  soft-tissue]
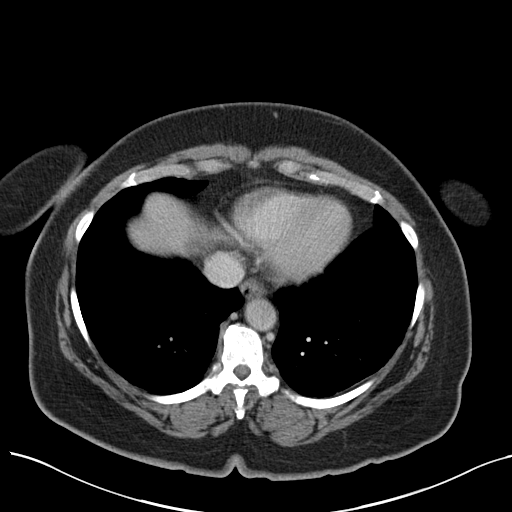

[Series 4: coronal st · coronal · 0.89mm/px · 3 of 133 slices shown]
[im 45/133  soft-tissue]
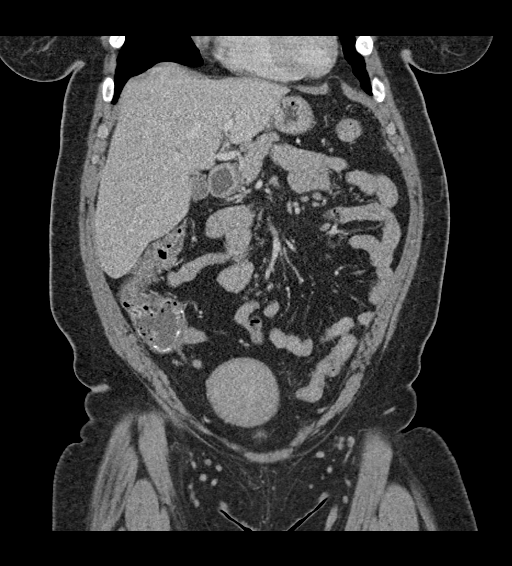
[im 59/133  soft-tissue]
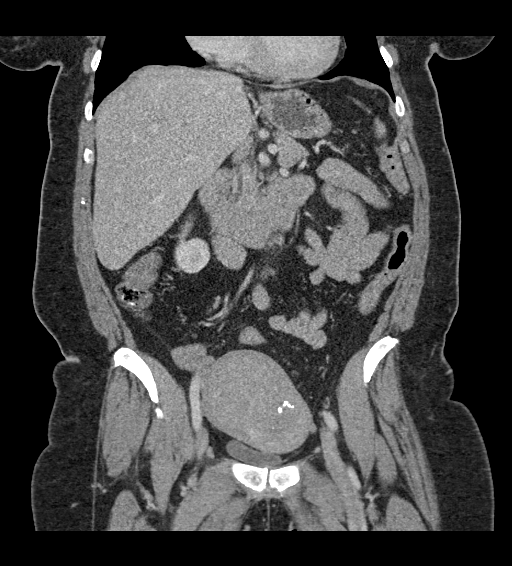
[im 74/133  soft-tissue]
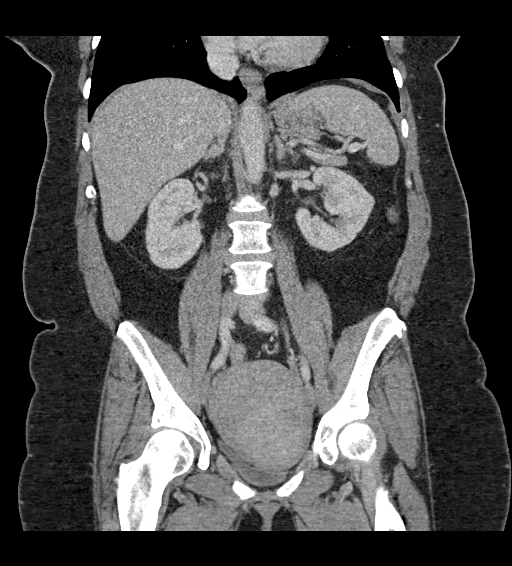

[15 of 46 positions shown; findings below may reference images not displayed]

FINDINGS: Lower chest: Limited visualization of lower thorax demonstrates
minimal dependent subpleural ground-glass atelectasis. No discrete
focal airspace opacities. No pleural effusion.

Normal heart size.  No pericardial effusion.

Hepatobiliary: Normal hepatic contour. No discrete hepatic lesions.
Normal appearance of the gallbladder given degree distention. No
radiopaque gallstones. No intra or extrahepatic biliary ductal
dilatation. No ascites.

Pancreas: Normal appearance of the pancreas.

Spleen: Appearance of the spleen.

Adrenals/Urinary Tract: There is symmetric enhancement and excretion
of the bilateral kidneys. No definite renal stones on this
postcontrast examination. Punctate (approximately 0.8 cm)
hypoattenuating lesion within the inferior pole the left kidney
(image 68, series 4), is too small to adequately characterize of
favored to represent a renal cyst. No definite right-sided renal
lesions. No urine obstruction or perinephric stranding.

Normal appearance of the bilateral adrenal glands.

Normal appearance of the urinary bladder given underdistention.

Stomach/Bowel: Small hiatal hernia. The bowel is otherwise normal in
course and caliber without wall thickening or evidence of enteric
obstruction. Normal appearance of the terminal ileum and the
retrocecal appendix. No pneumoperitoneum, pneumatosis or portal
venous gas.

Vascular/Lymphatic: Normal caliber the abdominal aorta. The major
branch vessels of the abdominal aorta appear patent on this non CTA
examination.

No bulky retroperitoneal, mesenteric, pelvic or inguinal
lymphadenopathy.

Reproductive: Enlarged Myomatous uterus with dominant fibroid within
the right-sided the dome of the uterine fundus measuring
approximately 7.6 x 7.5 cm (axial image 65, series 2) and dominant
fibroid within the lower uterine segment measuring approximately
x 6.3 cm (coronal image 83, series 4). Several of the uterine
fibroids have undergone partial calcific degeneration. No discrete
adnexal lesion. No free fluid in the pelvic cul-de-sac.

Other: Tiny mesenteric fat containing periumbilical hernia.

Musculoskeletal: No acute or aggressive osseous abnormalities.
IMPRESSION: 1. No explanation for patient's abdominal pain and diarrhea.
Specifically, no evidence of enteric or urinary obstruction. Normal
appearance of the retrocecal appendix.
2. Small hiatal hernia.
3. Enlarged myomatous uterus.

## 2020-04-15 IMAGING — CR DG ABDOMEN 1V
2 series · 2 of 2 positions shown · non-contrast
Comparison: None.

CLINICAL DATA: Pt complains of low back pain x 7 wks. in her lumbar
region which is relieved with defecation. Pt states the pain is
worse when constipated, so she takes stool softeners to keep the
pain at bay. pain relief with defecation, LBP

EXAM:
ABDOMEN - 1 VIEW

[abdomen kub (1 of 2)]
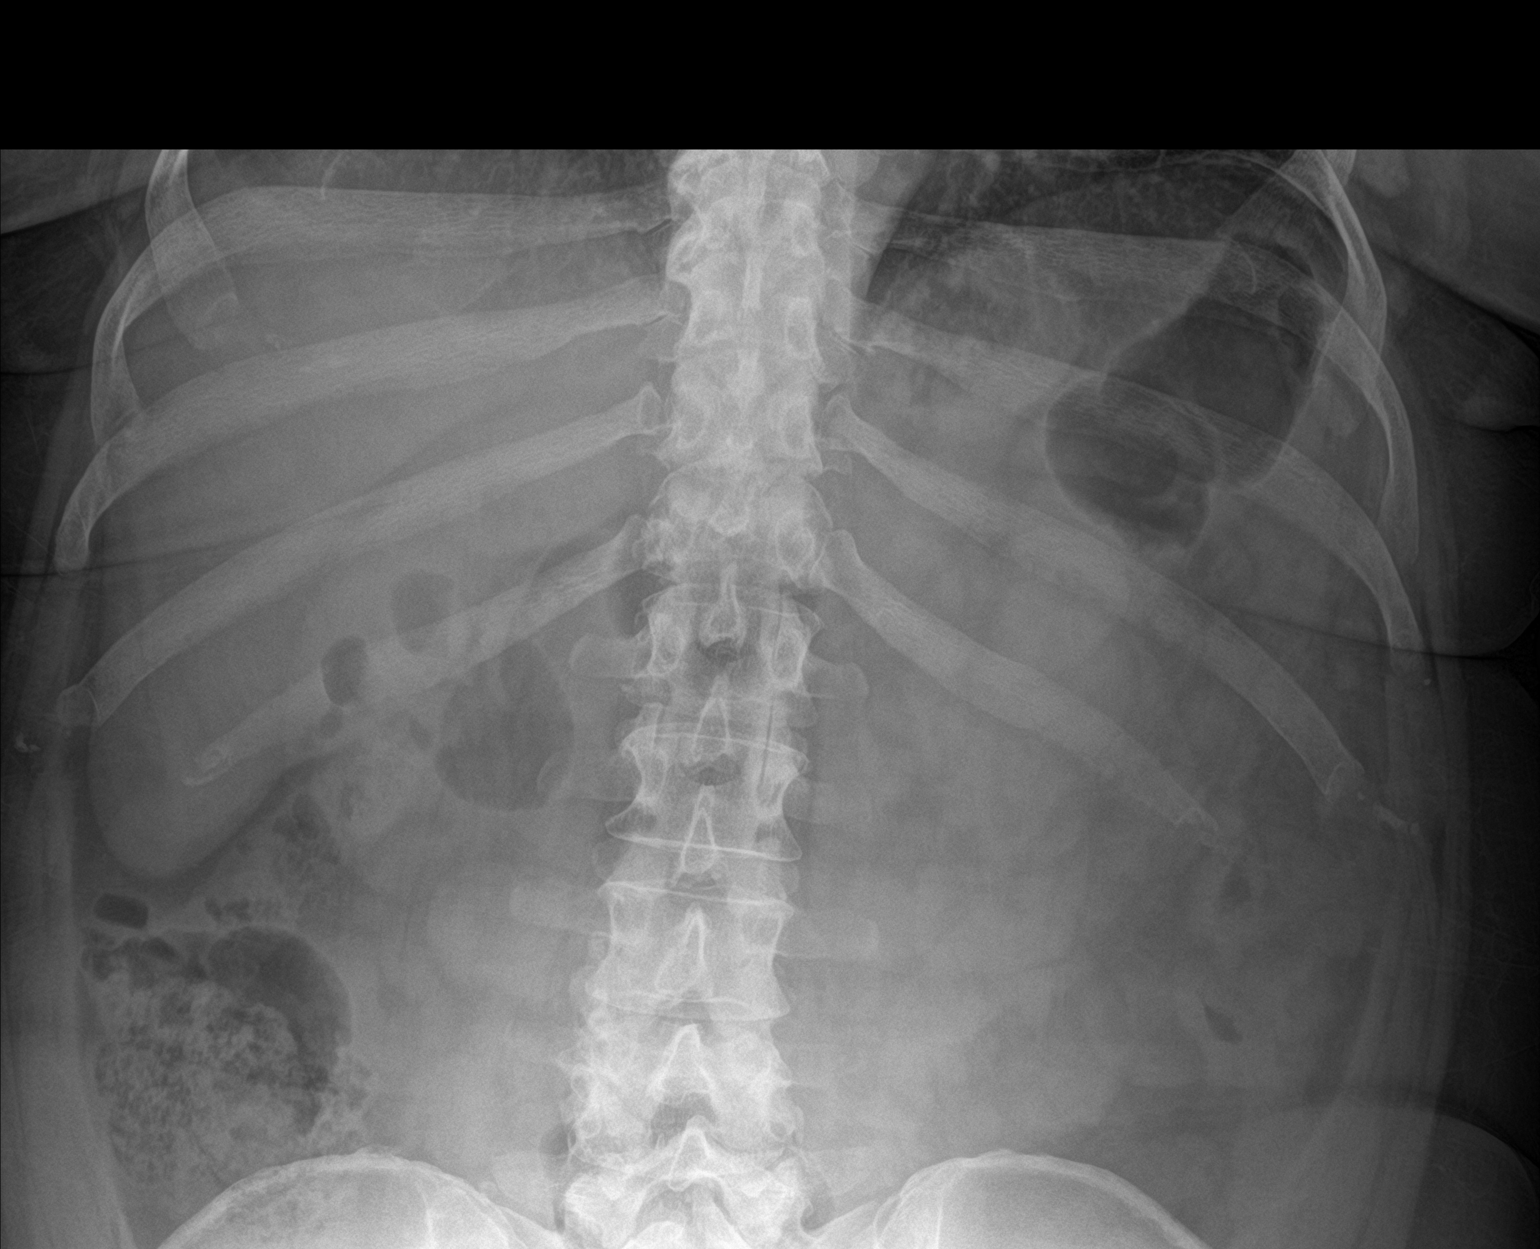

[abdomen kub (2 of 2)]
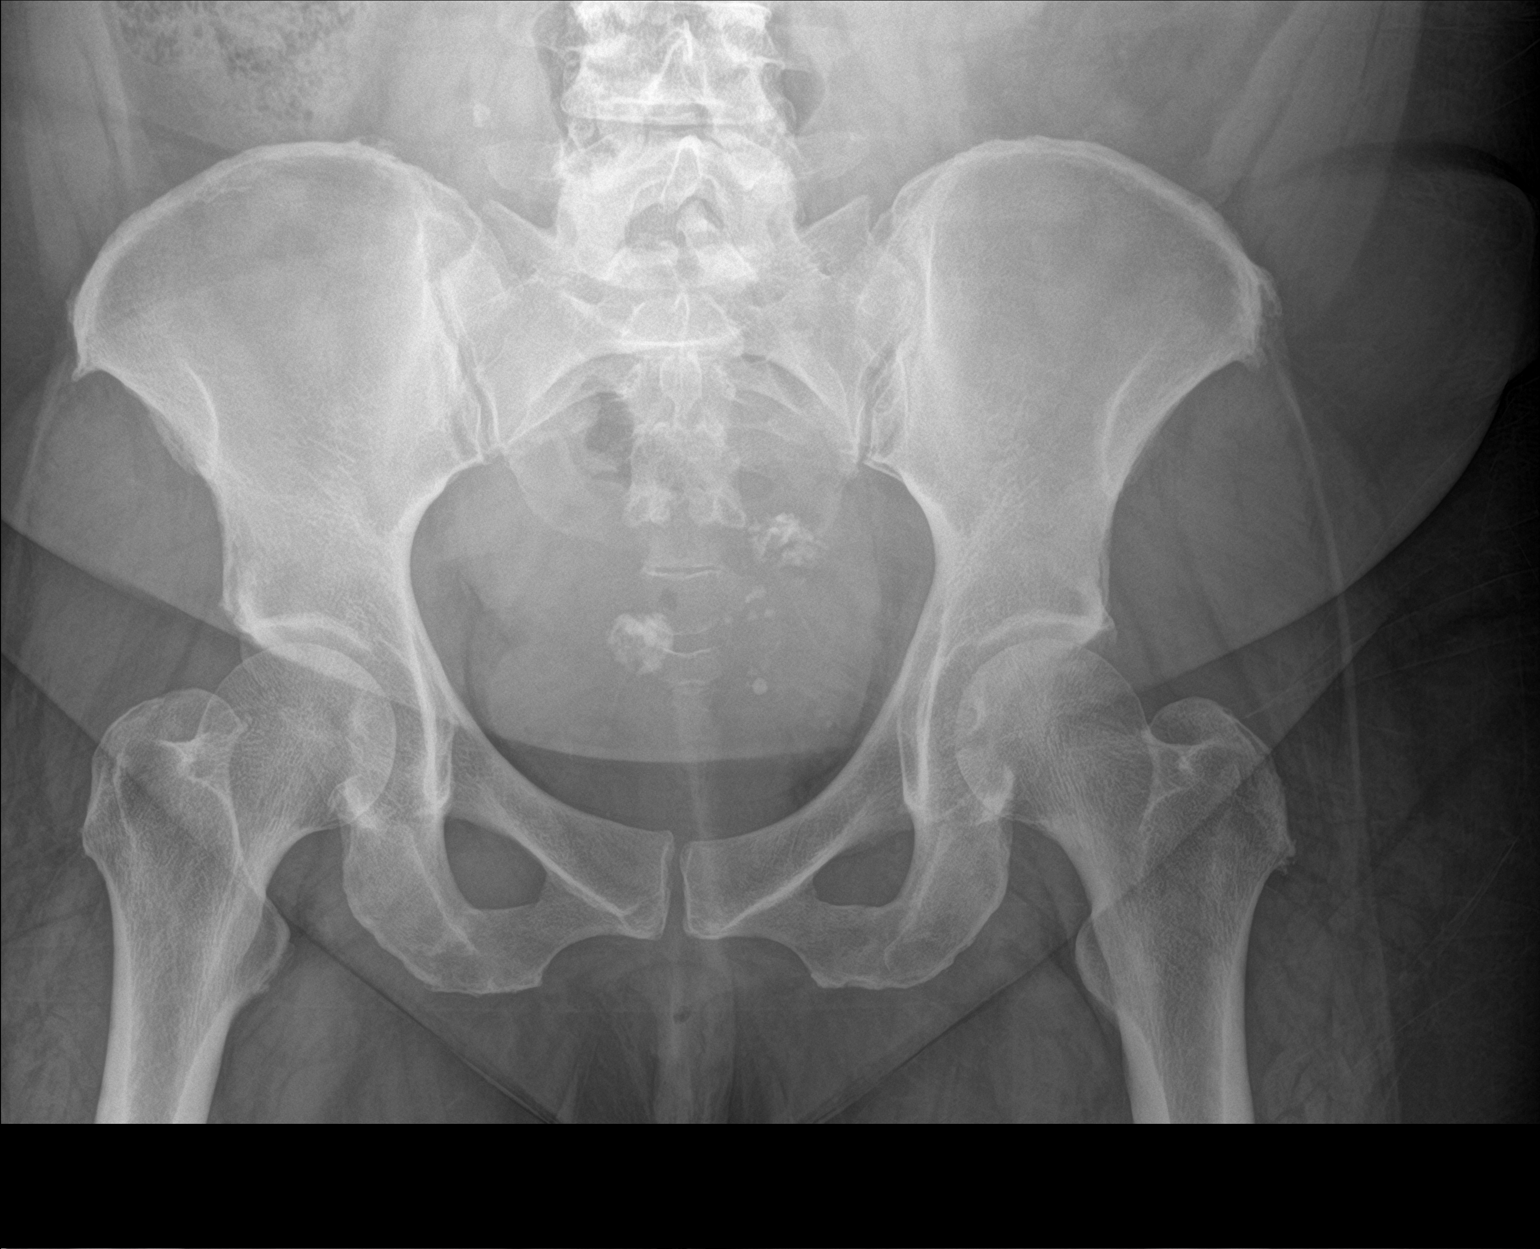

[2 of 2 positions shown; findings below may reference images not displayed]

FINDINGS: No dilated large or small bowel. Normal volume stool. Calcified
leiomyomas in pelvis. No nephrolithiasis identified. Degenerate
changes lower lumbar spine. Spina bifida occulta at S1.
IMPRESSION: Normal bowel-gas pattern.  Normal stool volume
